# Patient Record
Sex: Female | Born: 1950
Health system: Southern US, Community
[De-identification: ages and names within clinical notes are randomized; demographics above are authoritative.]

## PROBLEM LIST (undated history)

## (undated) DIAGNOSIS — F419 Anxiety disorder, unspecified: Secondary | ICD-10-CM

## (undated) DIAGNOSIS — J309 Allergic rhinitis, unspecified: Secondary | ICD-10-CM

## (undated) DIAGNOSIS — F32A Depression, unspecified: Secondary | ICD-10-CM

## (undated) DIAGNOSIS — G709 Myoneural disorder, unspecified: Secondary | ICD-10-CM

## (undated) DIAGNOSIS — I10 Essential (primary) hypertension: Secondary | ICD-10-CM

## (undated) DIAGNOSIS — K219 Gastro-esophageal reflux disease without esophagitis: Secondary | ICD-10-CM

## (undated) DIAGNOSIS — E785 Hyperlipidemia, unspecified: Secondary | ICD-10-CM

## (undated) DIAGNOSIS — I499 Cardiac arrhythmia, unspecified: Secondary | ICD-10-CM

## (undated) DIAGNOSIS — T7840XA Allergy, unspecified, initial encounter: Secondary | ICD-10-CM

## (undated) DIAGNOSIS — M4800 Spinal stenosis, site unspecified: Secondary | ICD-10-CM

## (undated) DIAGNOSIS — G43909 Migraine, unspecified, not intractable, without status migrainosus: Secondary | ICD-10-CM

## (undated) DIAGNOSIS — K589 Irritable bowel syndrome without diarrhea: Secondary | ICD-10-CM

## (undated) DIAGNOSIS — Z9889 Other specified postprocedural states: Secondary | ICD-10-CM

## (undated) DIAGNOSIS — C801 Malignant (primary) neoplasm, unspecified: Secondary | ICD-10-CM

## (undated) DIAGNOSIS — K648 Other hemorrhoids: Secondary | ICD-10-CM

## (undated) DIAGNOSIS — F329 Major depressive disorder, single episode, unspecified: Secondary | ICD-10-CM

## (undated) DIAGNOSIS — K449 Diaphragmatic hernia without obstruction or gangrene: Secondary | ICD-10-CM

## (undated) HISTORY — DX: Cardiac arrhythmia, unspecified: I49.9

## (undated) HISTORY — PX: COLONOSCOPY: SHX174

## (undated) HISTORY — DX: Hyperlipidemia, unspecified: E78.5

## (undated) HISTORY — DX: Migraine, unspecified, not intractable, without status migrainosus: G43.909

## (undated) HISTORY — PX: WISDOM TOOTH EXTRACTION: SHX21

## (undated) HISTORY — DX: Other specified postprocedural states: Z98.890

## (undated) HISTORY — DX: Myoneural disorder, unspecified: G70.9

## (undated) HISTORY — DX: Malignant (primary) neoplasm, unspecified: C80.1

## (undated) HISTORY — DX: Gastro-esophageal reflux disease without esophagitis: K21.9

## (undated) HISTORY — DX: Anxiety disorder, unspecified: F41.9

## (undated) HISTORY — DX: Allergic rhinitis, unspecified: J30.9

## (undated) HISTORY — DX: Diaphragmatic hernia without obstruction or gangrene: K44.9

## (undated) HISTORY — DX: Other hemorrhoids: K64.8

## (undated) HISTORY — DX: Major depressive disorder, single episode, unspecified: F32.9

## (undated) HISTORY — PX: TONSILLECTOMY: SUR1361

## (undated) HISTORY — DX: Allergy, unspecified, initial encounter: T78.40XA

## (undated) HISTORY — PX: VAGINAL HYSTERECTOMY: SUR661

## (undated) HISTORY — DX: Irritable bowel syndrome, unspecified: K58.9

## (undated) HISTORY — DX: Spinal stenosis, site unspecified: M48.00

## (undated) HISTORY — DX: Depression, unspecified: F32.A

---

## 1999-08-22 ENCOUNTER — Other Ambulatory Visit: Admission: RE | Admit: 1999-08-22 | Discharge: 1999-08-22 | Payer: Self-pay | Admitting: Obstetrics and Gynecology

## 2000-10-02 ENCOUNTER — Other Ambulatory Visit: Admission: RE | Admit: 2000-10-02 | Discharge: 2000-10-02 | Payer: Self-pay | Admitting: Obstetrics and Gynecology

## 2001-03-15 ENCOUNTER — Observation Stay (HOSPITAL_COMMUNITY): Admission: RE | Admit: 2001-03-15 | Discharge: 2001-03-16 | Payer: Self-pay | Admitting: Obstetrics and Gynecology

## 2001-03-15 ENCOUNTER — Encounter (INDEPENDENT_AMBULATORY_CARE_PROVIDER_SITE_OTHER): Payer: Self-pay | Admitting: *Deleted

## 2003-12-18 ENCOUNTER — Other Ambulatory Visit: Admission: RE | Admit: 2003-12-18 | Discharge: 2003-12-18 | Payer: Self-pay | Admitting: Obstetrics and Gynecology

## 2004-03-10 ENCOUNTER — Inpatient Hospital Stay (HOSPITAL_COMMUNITY): Admission: EM | Admit: 2004-03-10 | Discharge: 2004-03-13 | Payer: Self-pay

## 2004-03-29 ENCOUNTER — Emergency Department (HOSPITAL_COMMUNITY): Admission: EM | Admit: 2004-03-29 | Discharge: 2004-03-29 | Payer: Self-pay | Admitting: Emergency Medicine

## 2004-04-16 ENCOUNTER — Ambulatory Visit (HOSPITAL_COMMUNITY): Admission: RE | Admit: 2004-04-16 | Discharge: 2004-04-16 | Payer: Self-pay | Admitting: Internal Medicine

## 2004-06-17 ENCOUNTER — Encounter: Admission: RE | Admit: 2004-06-17 | Discharge: 2004-06-17 | Payer: Self-pay | Admitting: Gastroenterology

## 2005-05-21 ENCOUNTER — Other Ambulatory Visit: Admission: RE | Admit: 2005-05-21 | Discharge: 2005-05-21 | Payer: Self-pay | Admitting: Obstetrics and Gynecology

## 2006-11-02 ENCOUNTER — Encounter: Admission: RE | Admit: 2006-11-02 | Discharge: 2006-11-02 | Payer: Self-pay | Admitting: Obstetrics and Gynecology

## 2006-11-06 ENCOUNTER — Encounter: Admission: RE | Admit: 2006-11-06 | Discharge: 2006-11-06 | Payer: Self-pay | Admitting: Obstetrics and Gynecology

## 2010-07-10 ENCOUNTER — Emergency Department (HOSPITAL_COMMUNITY): Admission: EM | Admit: 2010-07-10 | Discharge: 2010-07-10 | Payer: Self-pay | Admitting: Emergency Medicine

## 2011-01-24 NOTE — Op Note (Signed)
[facility]  Patient:    [name], [name]                     MRN: [id number] Proc. Date: 03/15/01 Adm. Date:  [id number] Attending:  Trevor Iha                           Operative Report  PREOPERATIVE DIAGNOSIS:  Menometrorrhagia, dysmenorrhea, and probable pelvic fibroids.  POSTOPERATIVE DIAGNOSIS:  Menometrorrhagia, dysmenorrhea, and probable pelvic fibroids plus pelvic adhesions.  PROCEDURE:  Laparoscopically assisted vaginal hysterectomy, bilateral salpingo-oophorectomy, and lysis of adhesions.  SURGEON:  Trevor Iha, M.D.  ASSISTANT:  Juluis Mire, M.D.  ANESTHESIA:  General endotracheal.  INDICATIONS:  Ms. Mcnew is a 60 year old, G4, P4, with worsening menometrorrhagia and dysmenorrhea over the last several years not responsive to hysteroscopy and D&C, anti-inflammatory medications, or oral contraceptive agents.  The patient presents today for definitive surgical intervention. Risks and benefits were discussed at length and informed consent was obtained. This includes, but not limited to, risk of infection, bleeding, damage to bowel, bladder, or ureters, risks associated with blood transfusion including hepatitis, AIDS, or reaction to blood transfusion.  Again, informed consent was obtained.  FINDINGS AT TIME OF SURGERY:  Slightly enlarged uterus with adhesions from the left and right tubo-ovarian complexes to the pelvic sidewalls, uterus, and bowel.  Normal appearing appendix and normal appearing liver.  DESCRIPTION OF PROCEDURE:  After adequate analgesia, the patient was placed in the dorsolithotomy position.  She was sterilely prepped and draped and bladder was thoroughly drained.  The Hulka tenaculum was placed on the anterior lip of the cervix.  A 1 cm infraumbilical skin incision was made, a 12 mm trocar was inserted, and 5 mm trocar was inserted to the left of the midline _____ of the pubic synthesis under direct  visualization.  The above findings were then noted.  Lysis of adhesions was performed with sharp Endo shears, freeing the tubes and ovaries from the pelvic sidewalls and also from the bowel, also removing bowel appendages from the posterior portion of the uterus and left tubo-ovarian complex.  Bleeding was controlled with bipolar cautery.  At this time a 5 mm camera was inserted, Endo-GIA was inserted through the 12 mm scope, and Endo-GIA was placed across the right infundibulopelvic ligament.  With good placement noted, it was fired once, reloaded, and fired a second time down to the level of the round ligament with care taken to avoid the underlying ureter and hemostasis was achieved.  This was done similarly along the left infundibulopelvic ligament down to just next to the round ligament. Some small bleeding in between the staples was controlled with bipolar cautery.  At this time, the laparoscope was removed, the abdomen was desufflated, legs were elevated, and weighted speculum was placed in the vagina.  _____ tenaculum was placed on the posterior lip of the cervix and a posterior colpotomy was performed.  Haney clamps were placed across the uterosacral ligaments bilaterally.  It was clamped, cut, and tied using 0 Monocryl sutures.  0 Monocryl was used throughout the procedure unless otherwise indicated.  The cervix was circumscribed with Bovie cautery.  The cardinal ligaments were then clamped, cut, and tied bilaterally using 0 Monocryl. Bladder and pelvis was then clamped, cut, and tied.  The bladder was then dissected off the anterior surface of the cervix and anterior peritoneum was entered.  A Diva retractor was placed  underneath the bladder.  At this time the remainder of the bladder/pelvis were clamped, cut, and tied bilaterally. Successive bites of the cardinal ligaments up into the round ligament were performed with good hemostasis achieved.  At this point the fundus of  the uterus was grasped with thyroid tenaculum, delivered into the introitus.  At this point Haney clmaps were placed across the remaining portion of the broad ligaments bilaterally, clamped, cut, and tied.  The uterus was removed.  Tubes and ovaries were also removed.  At this time it was doubly ligated across the remainder of the broad ligaments.  Good hemostasis was achieved.  At this point the uterosacral ligaments were identified bilaterally, grasped with Alice clamps, suture ligated with 0 Monocryl suture.  The posterior peritoneum was then closed with 0 Monocryl in a purse string fashion and was closed in a vertical fashion with figure-of-eight and 0 Monocryl.  The anterior vaginal mucosa was then closed with figure-of-eight and 0 Monocryl in a vertical fashion with good approximation and good hemostasis.  Uterosacral ligaments were plicated in the midline and good hemostasis was achieved at all levels.  Foley catheter was then placed with good return of clear yellow urine.  Abdomen was reinsufflated and reexamination of cul-de-sac and pelvis revealed good hemostasis.  Small plica appendage which was still adhesed to the posterior vaginal mucosa was sharply lysed with good hemostasis achieved. At this point the abdomen was desufflated, the infraumbilical skin incision was closed with 0 chromic in the fascia and a 3-0 Monocryl subcuticular stitch in the skin at umbilicus, 3-0 Monocryl of 5 mm port with good approximation. Incisions were injected with 0.25% Marcaine.  The patient tolerated the procedure well and was stable and transported to recovery room.  The sponge and instrument count was normal x 3.  ESTIMATED BLOOD LOSS:  100 cc. DD:  03/15/01 TD:  03/15/01 Job: [id number]

## 2011-01-24 NOTE — Cardiovascular Report (Signed)
NAMEPAYDEN, [name]                        ACCOUNT NO.:  [id number]   MEDICAL RECORD NO.:  [id number]                   PATIENT TYPE:  INP   LOCATION:  2905                                 FACILITY:  [facility]   PHYSICIAN:  Charlton Haws, M.D.                  DATE OF BIRTH:  May 19, 1951   DATE OF PROCEDURE:  DATE OF DISCHARGE:                              CARDIAC CATHETERIZATION   PROCEDURE:  Coronary arteriography.   INDICATIONS:  Chest pain requiring hospital admission.   Cine catheterization was done from the right femoral artery, 6 French JR4  and JL4 catheters were used for cannulating the coronaries.  The patient  tolerated the procedure well.   2 mg of Versed were given for sedation.   1. The left main coronary artery was normal.  2. The left anterior descending artery was normal.  3. The first and second diagonal branches were normal.  4. The circumflex coronary artery was nondominant and was normal.;  5. The right coronary artery was dominant, and it was normal.   RAO ventriculography:  RAO ventriculography was normal, EF was 60%.  There  was no gradient across the aortic valve and no MR.  LV pressure was 147/78,  aortic pressure was 145/18.   IMPRESSION:  The patient has no significant coronary artery disease.  The  chest pain would appear to be noncardiac in etiology.  There is a question  on admission chest x-ray of a right middle lobe pneumonia.  Follow-up PA and  lateral chest x-ray showed an apical fat pad with no evidence of pneumonia  or no active disease.   Due to the late hour, the patient will be monitored overnight.  If her groin  heals well, she will probably discharged for primary care follow-up in the  morning.                                               Charlton Haws, M.D.    PN/MEDQ  D:  03/12/2004  T:  03/13/2004  Job:  [id number]   cc:   Larina Earthly, M.D.  [address]  [place]  [place] [address]  Fax: [phone]   Rollene Rotunda, M.D.

## 2011-01-24 NOTE — H&P (Signed)
[facility]  Patient:    [name], [name]                       MRN: [id number] Adm. Date:  03/15/01 Attending:  Trevor Iha, M.D.                         History and Physical  DATE OF BIRTH:  1950-11-27  HISTORY OF PRESENT ILLNESS:  [name] is a 60 year old, G4, P4 with worsening menometrorrhagia and known leiomyomatous uterus who presents for definitive surgical intervention.  She has had menometrorrhagia over the last several years which has not improved by having a hysteroscopy, dilatation and curettage in January 1999.  She has been tried on oral contraceptive agents and has continued to have abnormal uterine bleeding despite doubling up on oral contraceptive agents which does give her headaches.  Also has episodes of cramping and pelvic pain which required high dose of anti-inflammatory medications.  Because of the persistent menometrorrhagia, dysmenorrhea and fibroids the patient presents for definitive surgical intervention and presents for hysterectomy.  PAST MEDICAL HISTORY:  Negative.  PAST SURGICAL HISTORY:  Significant for tubal ligation.  PAST OB HISTORY:  She has had four vaginal deliveries.  GYN HISTORY:  No history of sexually transmitted diseases or abnormal Pap smears.  MEDICATIONS:  Currently on none.  ALLERGIES:  She has no known drug allergies.  PHYSICAL EXAMINATION:  VITAL SIGNS:  Blood pressure is 120/82.  HEART: Regular rate and rhythm.  LUNGS:  Clear to auscultation bilaterally.  ABDOMEN:  Nondistended.  Nontender.  PELVIC:  Uterus is anteverted and mobile, nontender.  No adnexal masses are palpable.  Ultrasound from approximately one year ago did show 12.9 x 5.3 x 7 cm uterus with several intramural leiomyomas noted at 19 x 13 mm and 19 x 16 mm.  Normal appearing ovaries.  IMPRESSION:  Menometrorrhagia, pelvic pain, dysmenorrhea, probable fibroids by ultrasound and dilatation and curettage.   None responsive to conservative medical management including oral contraceptive agents or dilatation and curettage.  The patient desires definitive surgical intervention.  PLAN:  Laparoscopically assisted vaginal hysterectomy and bilateral salpingo-oophorectomy.  The risks and benefits of procedure were discussed at length which include but are not limited to risk of infection, bleeding, damage to bowel, bladder, ureters; also the risks associated with hormone replacement therapy after removing the tubes and ovaries.  Discussed at length the different options including ovarian preservation versus removal.  At this time, we will plan to proceed with laparoscopically assisted vaginal hysterectomy and bilateral salpingo-oophorectomy.  The risks also associated with blood transfusion including risks of infection such as hepatitis or AIDS and also reactions to blood transfusion were also discussed with the patient. Again informed consent was obtained.DD:  03/14/01 TD:  03/14/01 Job: [id number]

## 2011-01-24 NOTE — H&P (Signed)
[name], [name]                        ACCOUNT NO.:  [id number]   MEDICAL RECORD NO.:  [id number]                   PATIENT TYPE:  INP   LOCATION:  2905                                 FACILITY:  [facility]   PHYSICIAN:  Tereso Newcomer, P.A.                  DATE OF BIRTH:  September 12, 1950   DATE OF ADMISSION:  03/10/2004  DATE OF DISCHARGE:                                HISTORY & PHYSICAL   ADDENDUM:  The patient will also need outpatient carotid Dopplers to further  evaluate the questionable right carotid bruit.  This will be arranged at  discharge.                                                Tereso Newcomer, P.A.    SW/MEDQ  D:  03/10/2004  T:  03/11/2004  Job:  [id number]   cc:   Rollene Rotunda, M.D.   Larina Earthly, M.D.  [address]  [place]  [place] [address]  Fax: [phone]

## 2011-01-24 NOTE — H&P (Signed)
NAMEROSE, HEGNER                        ACCOUNT NO.:  [id number]   MEDICAL RECORD NO.:  [id number]                   PATIENT TYPE:  INP   LOCATION:  2905                                 FACILITY:  [facility]   PHYSICIAN:  Tereso Newcomer, P.A.                  DATE OF BIRTH:  1951/08/28   DATE OF ADMISSION:  03/10/2004  DATE OF DISCHARGE:                                HISTORY & PHYSICAL   CHIEF COMPLAINT:  Chest pain.   HISTORY OF PRESENT ILLNESS:  Ms. Cory is a very pleasant 60 year old very  pleasant female with known history of coronary artery disease, who presents  to the emergency room today __________.  Over the last couple of months she  has had some occasional episodes of chest pain.  These episodes have been  associated with headache, lower extremity swelling as well as neck  tightness.  She says it feels like she is choking.  It is really not  associated with exertion.  Last night it was the worst ever.  __________.  She had her blood sugars checked.  They were elevated __________.  Worse  __________ at __________  health care.  She tried to work __________.                                                Tereso Newcomer, Rochel Brome.    SW/MEDQ  D:  03/10/2004  T:  03/11/2004  Job:  [id number]   cc:   Larina Earthly, M.D.  [address]  [place]  [place] [address]  Fax: [phone]

## 2011-01-24 NOTE — Discharge Summary (Signed)
[facility]  Patient:    [name], [name]                     MRN: [id number] Adm. Date:  [id number] Disc. Date: 03/16/01 Attending:  Trevor Iha                           Discharge Summary  HISTORY OF PRESENT ILLNESS:  [name] is a 60 year old G4, P4 with worsening menomenorrhagia, dysmenorrhea over the last several years not responsive to [facility], anti-inflammatory medications, or oral contraceptive agents.  She presented for laparoscopically assisted vaginal hysterectomy, bilateral salpingo-oophorectomy.  HOSPITAL COURSE:  Patient underwent LAVH, BSO with lysis of adhesions. Findings at the time of surgery was a slightly enlarged uterus with adhesions to the left tube and right tubo-ovarian complexes to the pelvis side wall, uterus, and bowel.  Otherwise, normal appearing appendix and liver.  Surgery was uncomplicated.  Estimated blood loss was 100 cc.  Hospital course was uncomplicated.  By postoperative day #1 she was ambulating without difficulty, tolerating a regular diet, passing flatus.  Postoperative hemoglobin was 9.6. Patient was discharged home.  At the time of discharge the incision was clean, dry, and intact.  Bowel sounds were positive.  DISPOSITION:  Patient will be discharged home with followup in the office in one to two weeks.  She is sent home with a prescription for Motrin 800 mg q.8h. p.r.n. total of 30 and Tylox one to two q.4h. p.r.n. #30.  DISCHARGE DIAGNOSES:  Menomenorrhagia, dysmenorrhea, pelvic pain, pelvic adhesive disease.  CONDITION ON DISCHARGE:  Good. DD:  03/16/01 TD:  03/16/01 Job: [id number]

## 2011-01-24 NOTE — Discharge Summary (Signed)
[name], [name]                        ACCOUNT NO.:  [id number]   MEDICAL RECORD NO.:  [id number]                   PATIENT TYPE:  INP   LOCATION:  2029                                 FACILITY:  [facility]   PHYSICIAN:  Rollene Rotunda, M.D.                DATE OF BIRTH:  02/07/1951   DATE OF ADMISSION:  03/10/2004  DATE OF DISCHARGE:  03/13/2004                           DISCHARGE SUMMARY - REFERRING   DISCHARGE DIAGNOSES:  1. Chest pain, noncardiac.  2. Right carotid bruit.  3. Hyperlipidemia, treated.  4. Hypertension, treated.  5. Spinal stenosis.  6. History of C2 fracture in 1992.  7. Status post hysterectomy, 2001.   HOSPITAL COURSE:  [name] is a 60 year old patient of Dr. Felipa Eth of  [facility] who has no known history of coronary artery disease.  She  presented to the emergency room on March 10, 2004 complaining of chest pain  over the past 24 hours.  She had neck tightness as well as nausea,  diaphoresis, and shortness of breath.  After administration of heparin,  nitroglycerin, and morphine her pain had decreased.   Part of her workup included laboratory studies that revealed normal cardiac  isoenzymes.  Her BMET and CBC were essentially normal.  Total cholesterol  202, triglycerides 111, LDL 127, HDL was 53.  Her TSH was 3.162.  Chest x-  ray:  No active findings on March 12, 2004.  EKG reveals poor R wave  progression in the anterior leads with otherwise no acute ST-T wave changes.   The patient underwent cardiac catheterization on March 12, 2004 and had  angiographically-normal coronary arteries with a normal EF, no MR.  The  following day on March 13, 2004 the patient was discharged to home in stable  condition and appointments were made for follow-up.   DISCHARGE MEDICATIONS:  1. Enteric-coated aspirin 325 mg a day.  2. Toprol-XL 25 mg a day.  3. Captopril 6.25 t.i.d.  4. Lipitor 80 mg q.h.s.  5. Protonix 40 mg a day.   She is asked to buy a blood  pressure cuff and take her blood pressure two to  three times a day, record, and bring to her next visit.  She is also to  bring the cuff in so we check the calibration. May use Tylenol as needed.  No straining  or heavy lifting for 1 week.  Remain on a low fat diet.  Clean over  catheterization site with soap and water, no scrubbing.  She is to call [phone] for any questions or concerns.  On March 29, 2004 she is to come to our  office at 1:30 p.m. for a carotid ultrasound and then a visit at 2:30 p.m.      Guy Franco, P.A. [facility]                      Rollene Rotunda, M.D.  LB/MEDQ  D:  04/22/2004  T:  04/22/2004  Job:  [id number]   cc:   Larina Earthly, M.D.  [address]  [place]  [place] [address]  Fax: [phone]

## 2011-01-24 NOTE — H&P (Signed)
NAMEIRIE, DOWSON                        ACCOUNT NO.:  [id number]   MEDICAL RECORD NO.:  [id number]                   PATIENT TYPE:  EMS   LOCATION:  MAJO                                 FACILITY:  [facility]   PHYSICIAN:  Rollene Rotunda, M.D.                DATE OF BIRTH:  10/14/1950   DATE OF ADMISSION:  03/10/2004  DATE OF DISCHARGE:                                HISTORY & PHYSICAL   PRIMARY CARE PHYSICIAN:  Dr. Larina Earthly at [facility].   CARDIOLOGIST:  None -- she will be established with Dr. Rollene Rotunda.   CHIEF COMPLAINT:  Chest pain.   HISTORY OF PRESENT ILLNESS:  Ms. Habermann is a very pleasant 60 year old  female patient with no known history of coronary artery disease, who  presents to [facility] Emergency Room today with complaints of chest  discomfort beginning around 5 p.m. yesterday evening.  She has had  occasional episodes of chest discomfort over the last couple of months.  These would come and go.  There has really been no relation to exertion.  Her associated symptoms include throat tightness.  She says that it feels as  if it is choking her.  She also notes associated nausea and diaphoresis.  Her symptoms were their worst last night.  She works the night shift as a  Engineer, civil (consulting) at [employer].  Her symptoms usually begin with a headache  and lower extremity swelling.  She decided to go ahead and go to work.  She  noted that her blood pressure was elevated at 150/100 when she got to work.  She eventually decided to leave work and come to the emergency room.  In the  ER, she was placed on heparin and nitroglycerin as well as given aspirin and  morphine.  Originally, her chest discomfort was an 8/10 and now it is a  2/10.  She describes her chest discomfort as a tightness and actually  displays the Farwell sign when she describes it.  She also notes associated  shortness of breath.  She denies any syncope or presyncope.   PAST MEDICAL HISTORY:   Past medical history is significant for hysterectomy,  spinal stenosis and a history of C2 fracture.  She recently had her  cholesterol checked with her primary care physician and was noted to have  hypercholesterolemia with an LDL of 190.  Currently, she is on diet therapy  only.  She denies any known history of hypertension or diabetes mellitus or  thyroid disease.   CURRENT MEDICATIONS:  Current medications include allergy shots.   ALLERGIES:  No known drug allergies.   SOCIAL HISTORY:  She denies any tobacco or alcohol abuse.  She is married.  She lives in [place].  She works as a Engineer, civil (consulting) at [employer].   FAMILY HISTORY:  Family history is insignificant for any premature coronary  artery disease.  Her mother died have  an abdominal aortic aneurysm as well  as hypertension.  Her father died of emphysema.   REVIEW OF SYSTEMS:  Please see HPI.  She has had some arthralgias that seem  to be secondary to her spinal stenosis.  She also notes headaches as noted  above with her chest discomfort.  She denies any fevers, chills, cough, sore  throat, numbness, tingling, melena, hematochezia, hematuria, dysuria,  odynophagia, dysphagia or acid reflux symptoms.  Rest of the review of  systems are negative.   PHYSICAL EXAMINATION:  GENERAL:  She is a well-nourished, well-developed  female in no acute distress.  VITAL SIGNS:  Blood pressure is 130/72, pulse 60, respirations 12-15.  HEENT:  Head normocephalic, atraumatic.  Eyes:  PERRLA.  EOMI.  Sclerae are  clear.  ENT:  Oropharynx is pink without exudate.  NECK:  Neck without JVD.  ENDOCRINE:  Without thyromegaly.  LYMPH:  Without lymphadenopathy.  CARDIAC:  Normal S1 and S2.  Regular rate and rhythm without murmurs.  LUNGS:  Lungs are clear to auscultation bilaterally.  ABDOMEN:  Abdomen is soft and nontender without hepatomegaly.  EXTREMITIES:  Extremities without edema.  VASCULAR EXAM:  Carotid pulses 2+ bilaterally.  There is  a questionable very  faint right carotid bruit noted.  Femoral artery pulses are 2+ bilaterally  without bruits.  Distal pulses are 2+.  SKIN:  Skin is warm and dry.  NEUROLOGIC:  Nonfocal.   ACCESSORY CLINICAL DATA:  Electrocardiogram initially reveals normal sinus  rhythm with a heart rate of 83, normal axis, old anterior myocardial  infarction, minimal ST depression in V5 and V6.  Repeat EKG reveals sinus  rhythm with a heart rate of 60, occasional PVCs and continued anterior Q  waves.   Chest x-ray:  Questionable right middle lobe pneumonia.  Lateral film done  that shows that there is no pneumonia but probable prominent fat pad.   LABORATORY DATA:  Point-of-care enzymes negative x3.  Glucose 95, sodium  141, potassium 3.7.  Hemoglobin 13.6, hematocrit 40.  Creatinine 1.0.   IMPRESSION:  1. Unstable angina pectoris.  2. New diagnosis of hypertension.  3. Hypercholesterolemia -- untreated.  4. History of spinal stenosis.  5. Probable right carotid bruit noted on exam.   PLAN:  The patient will be admitted to [facility].  We will keep  her on telemetry and treat her with aspirin, heparin and nitroglycerin drip.  We will try to initiate low-dose beta blocker if her heart rate will allow.  We will also initiate low-dose ACE inhibitor with captopril 6.25 mg 3 times  a day.  We will place her a high-dose statin,  Lipitor 80 mg nightly, and cover her with Protonix 40 mg a day for GI  prophylaxis.  We will treat her pain with morphine and set her up for  cardiac catheterization on March 12, 2004.  Risks and benefits have been  explained to the patient and she agrees to proceed.      Tereso Newcomer, P.A.                        Rollene Rotunda, M.D.    SW/MEDQ  D:  03/10/2004  T:  03/11/2004  Job:  [id number]   cc:   Rollene Rotunda, M.D.   Larina Earthly, M.D.  [address]  [place]  [place] [address]  Fax: [phone]

## 2012-09-13 ENCOUNTER — Emergency Department (HOSPITAL_COMMUNITY)
Admission: EM | Admit: 2012-09-13 | Discharge: 2012-09-14 | Disposition: A | Discharge status: Home / Self Care | Payer: BC Managed Care – PPO | Attending: Emergency Medicine | Admitting: Emergency Medicine

## 2012-09-13 DIAGNOSIS — R42 Dizziness and giddiness: Secondary | ICD-10-CM | POA: Insufficient documentation

## 2012-09-13 DIAGNOSIS — R110 Nausea: Secondary | ICD-10-CM | POA: Insufficient documentation

## 2012-09-13 DIAGNOSIS — I10 Essential (primary) hypertension: Secondary | ICD-10-CM | POA: Insufficient documentation

## 2012-09-13 HISTORY — DX: Essential (primary) hypertension: I10

## 2012-09-14 ENCOUNTER — Emergency Department (HOSPITAL_COMMUNITY): Payer: BC Managed Care – PPO

## 2012-09-14 ENCOUNTER — Encounter (HOSPITAL_COMMUNITY): Payer: Self-pay | Admitting: *Deleted

## 2012-09-14 LAB — BASIC METABOLIC PANEL
BUN: 10 mg/dL (ref 6–23)
CO2: 26 mEq/L (ref 19–32)
Calcium: 9.7 mg/dL (ref 8.4–10.5)
Chloride: 103 mEq/L (ref 96–112)
Creatinine, Ser: 0.91 mg/dL (ref 0.50–1.10)
GFR calc Af Amer: 77 mL/min — ABNORMAL LOW (ref >90)
GFR calc non Af Amer: 67 mL/min — ABNORMAL LOW (ref >90)
Glucose, Bld: 91 mg/dL (ref 70–99)
Potassium: 3.4 mEq/L — ABNORMAL LOW (ref 3.5–5.1)
Sodium: 139 mEq/L (ref 135–145)

## 2012-09-14 LAB — CBC
HCT: 41.6 % (ref 36.0–46.0)
Hemoglobin: 14.3 g/dL (ref 12.0–15.0)
MCH: 30.4 pg (ref 26.0–34.0)
MCHC: 34.4 g/dL (ref 30.0–36.0)
MCV: 88.5 fL (ref 78.0–100.0)
Platelets: 336 10*3/uL (ref 150–400)
RBC: 4.7 MIL/uL (ref 3.87–5.11)
RDW: 12.4 % (ref 11.5–15.5)
WBC: 8.4 10*3/uL (ref 4.0–10.5)

## 2012-09-14 LAB — POCT I-STAT TROPONIN I: Troponin i, poc: 0 ng/mL (ref 0.00–0.08)

## 2012-09-14 MED ORDER — PROMETHAZINE HCL 25 MG/ML IJ SOLN
12.5000 mg | Freq: Once | INTRAMUSCULAR | Status: AC
  Administered 2012-09-14: 12.5 mg via INTRAVENOUS
  Filled 2012-09-14 (×2): qty 1

## 2012-09-14 MED ORDER — PROMETHAZINE HCL 25 MG PO TABS: 25.0000 mg | ORAL_TABLET | Freq: Four times a day (QID) | ORAL | Status: DC | PRN

## 2012-09-14 MED ORDER — DIAZEPAM 5 MG/ML IJ SOLN
2.5000 mg | Freq: Once | INTRAMUSCULAR | Status: AC
  Administered 2012-09-14: 2.5 mg via INTRAVENOUS
  Filled 2012-09-14: qty 2

## 2012-09-14 MED ORDER — ONDANSETRON 8 MG PO TBDP
8.0000 mg | ORAL_TABLET | Freq: Once | ORAL | Status: AC
  Administered 2012-09-14: 8 mg via ORAL
  Filled 2012-09-14: qty 1

## 2012-09-14 NOTE — ED Provider Notes (Signed)
History     CSN: [id number]  Arrival date & time 09/13/12  2354   First MD Initiated Contact with Patient 09/14/12 [phone]      Chief Complaint  Patient presents with  . Nausea    (Consider location/radiation/quality/duration/timing/severity/associated sxs/prior treatment) HPI This is a 62 year old female who has had episodic nausea since Christmas Day. Her episodes usually follow a meal. They do not occur after every meal. Her current episode began yesterday evening about 7 PM after dinner. The nausea was moderate to severe at its worst, with a sensation of a tightness in her throat. She never vomited. She has had no diarrhea with it. She was not short of breath. She was dizzy with it which he describes as lightheadedness. The symptoms are exacerbated by movement and improved with lying still although they never abated completely. She states that when she has the symptoms there's a baseline level of nausea with waves of worsening.  Past Medical History  Diagnosis Date  . Hypertension     History reviewed. No pertinent past surgical history.  No family history on file.  History  Substance Use Topics  . Smoking status: Never Smoker   . Smokeless tobacco: Not on file  . Alcohol Use: No    OB History    Grav Para Term Preterm Abortions TAB SAB Ect Mult Living                  Review of Systems  All other systems reviewed and are negative.    Allergies  Review of patient's allergies indicates no known allergies.  Home Medications   Current Outpatient Rx  Name  Route  Sig  Dispense  Refill  . BUPROPION HCL ER (SR) 100 MG PO TB12   Oral   Take 100 mg by mouth daily.         . IRBESARTAN 150 MG PO TABS   Oral   Take 75 mg by mouth at bedtime. Taking a half Tab         . OMEPRAZOLE 20 MG PO CPDR   Oral   Take 40 mg by mouth daily.           BP 141/65  Pulse 63  Temp 98.1 F (36.7 C)  Resp 13  SpO2 100%  Physical Exam General: Well-developed,  well-nourished female in no acute distress; appearance consistent with age of record HENT: normocephalic, atraumatic Eyes: pupils equal round and reactive to light; extraocular muscles intact; no nystagmus Neck: supple Heart: regular rate and rhythm; no murmurs, rubs or gallops Lungs: clear to auscultation bilaterally Abdomen: soft; nondistended; nontender; bowel sounds present Extremities: No deformity; full range of motion; pulses normal Neurologic: Awake, alert and oriented; motor function intact in all extremities and symmetric; no facial droop Skin: Warm and dry Psychiatric: Mildly anxious ,  ED Course  Procedures (including critical care time)     MDM   Nursing notes and vitals signs, including pulse oximetry, reviewed.  Summary of this visit's results, reviewed by myself:  Labs:  Results for orders placed during the hospital encounter of 09/13/12 (from the past 24 hour(s))  CBC     Status: Normal   Collection Time   09/14/12 12:30 AM      Component Value Range   WBC 8.4  4.0 - 10.5 K/uL   RBC 4.70  3.87 - 5.11 MIL/uL   Hemoglobin 14.3  12.0 - 15.0 g/dL   HCT 11.9  14.7 - 82.9 %  MCV 88.5  78.0 - 100.0 fL   MCH 30.4  26.0 - 34.0 pg   MCHC 34.4  30.0 - 36.0 g/dL   RDW 16.1  09.6 - 04.5 %   Platelets 336  150 - 400 K/uL  BASIC METABOLIC PANEL     Status: Abnormal   Collection Time   09/14/12 12:30 AM      Component Value Range   Sodium 139  135 - 145 mEq/L   Potassium 3.4 (*) 3.5 - 5.1 mEq/L   Chloride 103  96 - 112 mEq/L   CO2 26  19 - 32 mEq/L   Glucose, Bld 91  70 - 99 mg/dL   BUN 10  6 - 23 mg/dL   Creatinine, Ser 4.09  0.50 - 1.10 mg/dL   Calcium 9.7  8.4 - 81.1 mg/dL   GFR calc non Af Amer 67 (*) >90 mL/min   GFR calc Af Amer 77 (*) >90 mL/min  POCT I-STAT TROPONIN I     Status: Normal   Collection Time   09/14/12 12:36 AM      Component Value Range   Troponin i, poc 0.00  0.00 - 0.08 ng/mL   Comment 3             Imaging Studies: Dg Chest Port 1  View  09/14/2012  *RADIOLOGY REPORT*  Clinical Data: Nausea and chest pain.  PORTABLE CHEST - 1 VIEW  Comparison: 07/10/2010.  Findings: Normal heart size with clear lung fields.  No bony abnormality.   Right-sided epicardial fat pad. No change from priors.  IMPRESSION:  Negative exam.   Original Report Authenticated By: Davonna Belling, M.D.    EKG Interpretation:  Date & Time: 09/14/2012 12:05 AM  Rate: 72  Rhythm: normal sinus rhythm  QRS Axis: normal  Intervals: normal  ST/T Wave abnormalities: normal  Conduction Disutrbances:none  Narrative Interpretation: Poor R-wave progression  Old EKG Reviewed: unchanged  4:39 AM No change with IV Valium. Patient denies any chest symptoms.  6:05 AM Nausea improved with IV Phenergan. We'll prescribe Phenergan tablets. Patient will followup with Dr. Felipa Eth for recurrent unexplained nausea.           Hanley Seamen, MD 09/14/12 [phone]

## 2012-09-14 NOTE — ED Notes (Signed)
Pt c/o nausea starting at 1900 after eating; states has chest tightness on and off with the waves of nausea; has had several similar episodes since Christmas; c/o dizziness

## 2012-09-17 ENCOUNTER — Other Ambulatory Visit: Payer: Self-pay | Admitting: Internal Medicine

## 2012-09-17 DIAGNOSIS — R110 Nausea: Secondary | ICD-10-CM

## 2012-09-24 ENCOUNTER — Ambulatory Visit
Admission: RE | Admit: 2012-09-24 | Discharge: 2012-09-24 | Disposition: A | Discharge status: Home / Self Care | Payer: BC Managed Care – PPO | Source: Ambulatory Visit | Attending: Internal Medicine | Admitting: Internal Medicine

## 2012-09-24 DIAGNOSIS — R110 Nausea: Secondary | ICD-10-CM

## 2012-09-30 ENCOUNTER — Encounter: Payer: Self-pay | Admitting: Internal Medicine

## 2012-10-22 ENCOUNTER — Ambulatory Visit: Payer: BC Managed Care – PPO | Admitting: Internal Medicine

## 2012-11-02 ENCOUNTER — Encounter: Payer: Self-pay | Admitting: Internal Medicine

## 2012-11-04 ENCOUNTER — Encounter: Payer: Self-pay | Admitting: Internal Medicine

## 2012-11-04 ENCOUNTER — Ambulatory Visit (INDEPENDENT_AMBULATORY_CARE_PROVIDER_SITE_OTHER): Payer: BC Managed Care – PPO | Admitting: Internal Medicine

## 2012-11-04 ENCOUNTER — Other Ambulatory Visit (INDEPENDENT_AMBULATORY_CARE_PROVIDER_SITE_OTHER): Payer: BC Managed Care – PPO

## 2012-11-04 VITALS — BP 152/84 | HR 72 | Ht 63.0 in | Wt 183.2 lb

## 2012-11-04 DIAGNOSIS — Z1211 Encounter for screening for malignant neoplasm of colon: Secondary | ICD-10-CM

## 2012-11-04 DIAGNOSIS — R143 Flatulence: Secondary | ICD-10-CM

## 2012-11-04 DIAGNOSIS — K589 Irritable bowel syndrome without diarrhea: Secondary | ICD-10-CM | POA: Insufficient documentation

## 2012-11-04 DIAGNOSIS — R1010 Upper abdominal pain, unspecified: Secondary | ICD-10-CM

## 2012-11-04 DIAGNOSIS — I10 Essential (primary) hypertension: Secondary | ICD-10-CM | POA: Insufficient documentation

## 2012-11-04 DIAGNOSIS — R140 Abdominal distension (gaseous): Secondary | ICD-10-CM

## 2012-11-04 DIAGNOSIS — K219 Gastro-esophageal reflux disease without esophagitis: Secondary | ICD-10-CM | POA: Insufficient documentation

## 2012-11-04 DIAGNOSIS — R109 Unspecified abdominal pain: Secondary | ICD-10-CM

## 2012-11-04 DIAGNOSIS — R141 Gas pain: Secondary | ICD-10-CM

## 2012-11-04 LAB — CBC
HCT: 37.7 % (ref 36.0–46.0)
Hemoglobin: 12.8 g/dL (ref 12.0–15.0)
MCHC: 34 g/dL (ref 30.0–36.0)
MCV: 89.3 fl (ref 78.0–100.0)
Platelets: 309 10*3/uL (ref 150.0–400.0)
RBC: 4.23 Mil/uL (ref 3.87–5.11)
RDW: 13.1 % (ref 11.5–14.6)
WBC: 6.2 10*3/uL (ref 4.5–10.5)

## 2012-11-04 LAB — IGA: IgA: 596 mg/dL — ABNORMAL HIGH (ref 68–378)

## 2012-11-04 LAB — COMPREHENSIVE METABOLIC PANEL
ALT: 20 U/L (ref 0–35)
AST: 16 U/L (ref 0–37)
Albumin: 3.9 g/dL (ref 3.5–5.2)
Alkaline Phosphatase: 80 U/L (ref 39–117)
BUN: 12 mg/dL (ref 6–23)
CO2: 27 mEq/L (ref 19–32)
Calcium: 9.4 mg/dL (ref 8.4–10.5)
Chloride: 105 mEq/L (ref 96–112)
Creatinine, Ser: 0.9 mg/dL (ref 0.4–1.2)
GFR: 67.45 mL/min (ref >60.00)
Glucose, Bld: 83 mg/dL (ref 70–99)
Potassium: 3.4 mEq/L — ABNORMAL LOW (ref 3.5–5.1)
Sodium: 140 mEq/L (ref 135–145)
Total Bilirubin: 0.5 mg/dL (ref 0.3–1.2)
Total Protein: 7.3 g/dL (ref 6.0–8.3)

## 2012-11-04 LAB — TSH: TSH: 1.41 u[IU]/mL (ref 0.35–5.50)

## 2012-11-04 LAB — LIPASE: Lipase: 27 U/L (ref 11.0–59.0)

## 2012-11-04 MED ORDER — HYOSCYAMINE SULFATE 0.125 MG SL SUBL: 0.1250 mg | SUBLINGUAL_TABLET | SUBLINGUAL | Status: DC | PRN

## 2012-11-04 MED ORDER — PEG-KCL-NACL-NASULF-NA ASC-C 100 G PO SOLR: 1.0000 | Freq: Once | ORAL | Status: DC

## 2012-11-04 NOTE — Progress Notes (Signed)
Patient ID: [name], female   DOB: [birth year], 62 y.o.   MRN: [id number]  SUBJECTIVE: HPI [name] is a 62 yo female with PMH hypertension, GERD, irritable bowel who seen in consultation at the request of Dr. Felipa Eth for evaluation of upper abdominal pain.  Patient reports a long-standing history of "irritable stomach" which also seems to run in her family. She reports she has erratic bowel habits and "never knows what she is going to get". This is been going on for decades and seems to be triggered by eating. She occasionally has urgent bowel movements which can be loose and at other times have constipation. She's had no blood in her stool nor melena. Separate from her bowel habits, she has noticed upper abdominal pain both in the epigastrium and left upper quadrant which is worse after eating. She had severe nausea in early January, severe enough to necessitate an ER visit.  There she had an abdominal ultrasound and upper GI series. At that time her Prilosec was doubled from 40 mg daily to 40 mg twice daily and she was given a prescription for promethazine. Her nausea has significantly improved and she has not needed to take promethazine. She's not having any heartburn, dysphagia or odynophagia. She does report along with the upper abdominal discomfort, significant bloating. No fevers or chills. No vomiting. No significant weight loss  Colonoscopy was performed by Dr. Vilinda Boehringer in 2009, recommended repeat interval 5 years. This colonoscopy was normal, with recommended repeat interval based on family history of colon polyps  Review of Systems  As per history of present illness, otherwise negative   Past Medical History  Diagnosis Date  . Hypertension   . H/O colonoscopy     5 14 09 Dr Randa Evens  . GERD (gastroesophageal reflux disease)   . Allergic rhinitis   . Spinal stenosis   . Irregular heart beats   . Anxiety   . Depression   . IBS (irritable bowel syndrome)     Current  Outpatient Prescriptions  Medication Sig Dispense Refill  . buPROPion (WELLBUTRIN SR) 100 MG 12 hr tablet Take 100 mg by mouth daily.      . irbesartan (AVAPRO) 150 MG tablet Take 75 mg by mouth at bedtime. Taking a half Tab      . omeprazole (PRILOSEC) 40 MG capsule Take 40 mg by mouth 2 (two) times daily.      . promethazine (PHENERGAN) 25 MG tablet Take 1 tablet (25 mg total) by mouth every 6 (six) hours as needed for nausea.  20 tablet  0  . hyoscyamine (LEVSIN SL) 0.125 MG SL tablet Place 1 tablet (0.125 mg total) under the tongue every 4 (four) hours as needed for cramping.  30 tablet  0  . peg 3350 powder (MOVIPREP) 100 G SOLR Take 1 kit (100 g total) by mouth once.  1 kit  0   No current facility-administered medications for this visit.    No Known Allergies  Family History  Problem Relation Age of Onset  . Uterine cancer Mother   . Colon cancer Neg Hx   . Colon polyps Mother   . Heart disease Mother   . Irritable bowel syndrome Mother   . Irritable bowel syndrome Maternal Aunt   . Irritable bowel syndrome Cousin     x 4    History  Substance Use Topics  . Smoking status: Never Smoker   . Smokeless tobacco: Never Used  . Alcohol Use: Yes  Comment: very rare    OBJECTIVE: BP 152/84  Pulse 72  Ht 5\' 3"  (1.6 m)  Wt 183 lb 3.2 oz (83.099 kg)  BMI 32.46 kg/m2 Constitutional: Well-developed and well-nourished. No distress. HEENT: Normocephalic and atraumatic. Oropharynx is clear and moist. No oropharyngeal exudate. Conjunctivae are normal. No scleral icterus. Neck: Neck supple. Trachea midline. Cardiovascular: Normal rate, regular rhythm and intact distal pulses. No M/R/G Pulmonary/chest: Effort normal and breath sounds normal. No wheezing, rales or rhonchi. Abdominal: Soft, mild epigastric tenderness without rebound or guard, nondistended. Bowel sounds active throughout. There are no masses palpable. No hepatosplenomegaly. Extremities: no clubbing, cyanosis, or  edema Lymphadenopathy: No cervical adenopathy noted. Neurological: Alert and oriented to person place and time. Skin: Skin is warm and dry. No rashes noted. Psychiatric: Normal mood and affect. Behavior is normal.  Labs and Imaging -- CBC    Component Value Date/Time   WBC 8.4 09/14/2012 0030   RBC 4.70 09/14/2012 0030   HGB 14.3 09/14/2012 0030   HCT 41.6 09/14/2012 0030   PLT 336 09/14/2012 0030   MCV 88.5 09/14/2012 0030   MCH 30.4 09/14/2012 0030   MCHC 34.4 09/14/2012 0030   RDW 12.4 09/14/2012 0030    CMP     Component Value Date/Time   NA 139 09/14/2012 0030   K 3.4* 09/14/2012 0030   CL 103 09/14/2012 0030   CO2 26 09/14/2012 0030   GLUCOSE 91 09/14/2012 0030   BUN 10 09/14/2012 0030   CREATININE 0.91 09/14/2012 0030   CALCIUM 9.7 09/14/2012 0030   GFRNONAA 67* 09/14/2012 0030   GFRAA 77* 09/14/2012 0030   COMPLETE ABDOMINAL ULTRASOUND  - Jan 2014   Comparison:  CT abdomen of 06/17/2004   Findings:   Gallbladder:  The gallbladder is slightly contracted.  However, near the gallbladder fundus there are septations which are somewhat thickened and these septations do appear to contain blood flow. Although this area could represent focal adenomyomatosis, gallbladder carcinoma cannot be excluded and further assessment with MRI is recommended.  No gallstones are seen and there is no pain over the gallbladder with compression.   Common bile duct:  The common bile duct is somewhat prominent measuring 8.2 mm in diameter.   Liver:  The liver is slightly echogenic suggesting mild fatty infiltration.  No focal abnormality is seen. No central intrahepatic ductal dilatation is seen.   IVC:  Appears normal.   Pancreas:  The head and tail of the pancreas are obscured by bowel gas.   Spleen:  The spleen is normal measuring 5.9 cm sagittally.   Right Kidney:  No hydronephrosis is seen.  The right kidney measures 11.3 cm sagittally.  A parapelvic cyst is present of 1.5 cm in diameter.   Left Kidney:   No hydronephrosis is noted.  The left kidney measures 12.5 cm.  Parapelvic cysts are noted, the largest of 3.6 cm in diameter.   Abdominal aorta:  The abdominal aorta is normal in caliber.   IMPRESSION:   1.  Thickened septations with internal blood flow within the fundus of the gallbladder without definite gallstones.  This could represent focal adenomyomatosis, but gallbladder carcinoma cannot be excluded.  MRI of the liver is recommended with MRCP. 2.  Slightly prominent common bile duct measuring 8.2 mm without definite filling defect.  Again MRI assessment with MRCP is recommended. 3.  Mild fatty infiltration of the liver. 4.  The head and tail the pancreas are obscured by bowel gas. 5.  Bilateral parapelvic  renal cysts.  No hydronephrosis.   These findings were discussed with the physician covering for Dr. Felipa Eth, and the decision was made to continue with the scheduled upper GI and consider MRI for further assessment after clearing of the barium.   UPPER GI SERIES WITH KUB   Technique:  Routine upper GI series was performed with thin and high density barium.   Fluoroscopy Time: 1.9 minutes   Comparison:  Ultrasound of the abdomen from today   Findings: A preliminary film of the abdomen shows a nonspecific bowel gas pattern.  No opaque calculi are seen.  There are degenerative changes primarily at L4-5 and L5-S1.   A double contrast study was performed.  The esophagus is unremarkable.  A single contrast study shows the swallowing mechanism to be normal.  There is a small hiatal hernia present and moderate gastroesophageal reflux is demonstrated.  A barium pill was given at the end of the study which did pass into the stomach without delay.   The stomach is normal in contour and peristalsis.  The duodenal bulb fills and the duodenal loop is in normal position.   IMPRESSION:   1.  Small hiatal hernia with moderate gastroesophageal reflux. 2.  Barium pill passes  into the stomach without delay. 3.  No abnormality of the stomach or duodenum.   ASSESSMENT AND PLAN:  62 yo female with PMH hypertension, GERD, irritable bowel who seen in consultation at the request of Dr. Felipa Eth for evaluation of upper abdominal pain.  1.  Upper abd pain/bloating -- given the patient's abnormality of the gallbladder seen on ultrasound I recommended cross-sectional imaging with MRI abdomen plus MRCP. Her common bile duct was slightly prominent in the MRCP images should help better define this anatomy.  Increasing her PPI to twice daily seems to have helped her nausea, but her upper abdominal pain related to eating has persisted. I'm rechecking labs today to include CBC, CMP, celiac panel, lipase, TSH. If the MRI is unrevealing I recommended proceeding to upper endoscopy. Upper endoscopy as discussed and she was agreeable to proceed. We'll give her prescription for Levsin to be used as needed and as directed for abdominal cramping   2.  CRC screening -- she is due in 2014 for colonoscopy. She has requested that this test be done at the same time as her upper endoscopy. After discussing the test she is agreeable to proceed and we will perform it on the same day as her EGD. It EGD is not needed after her MRI results, we still will plan to proceed with colonoscopy for screening

## 2012-11-04 NOTE — Patient Instructions (Addendum)
You have been scheduled for a colonoscopy with propofol. Please follow written instructions given to you at your visit today.  Please pick up your prep kit at the pharmacy within the next 1-3 days. If you use inhalers (even only as needed) or a CPAP machine, please bring them with you on the day of your procedure.  Your physician has requested that you go to the basement for lab work before leaving today.  You have been scheduled for an MRI W/MRCP  On 11/09/2012   At  8:00am Please arrive at 7:45am Nothing to eat or drink 4 hours prior to your appointment

## 2012-11-05 LAB — TISSUE TRANSGLUTAMINASE, IGA: Tissue Transglutaminase Ab, IgA: 7.6 U/mL (ref <20)

## 2012-11-09 ENCOUNTER — Ambulatory Visit (HOSPITAL_COMMUNITY): Admission: RE | Admit: 2012-11-09 | Payer: BC Managed Care – PPO | Source: Ambulatory Visit

## 2012-11-11 ENCOUNTER — Ambulatory Visit (HOSPITAL_COMMUNITY)
Admission: RE | Admit: 2012-11-11 | Discharge: 2012-11-11 | Disposition: A | Discharge status: Home / Self Care | Payer: BC Managed Care – PPO | Source: Ambulatory Visit | Attending: Internal Medicine | Admitting: Internal Medicine

## 2012-11-11 ENCOUNTER — Other Ambulatory Visit: Payer: Self-pay | Admitting: Internal Medicine

## 2012-11-11 DIAGNOSIS — R935 Abnormal findings on diagnostic imaging of other abdominal regions, including retroperitoneum: Secondary | ICD-10-CM | POA: Insufficient documentation

## 2012-11-11 DIAGNOSIS — R52 Pain, unspecified: Secondary | ICD-10-CM

## 2012-11-11 DIAGNOSIS — R109 Unspecified abdominal pain: Secondary | ICD-10-CM | POA: Insufficient documentation

## 2012-11-11 DIAGNOSIS — Z1211 Encounter for screening for malignant neoplasm of colon: Secondary | ICD-10-CM

## 2012-11-11 DIAGNOSIS — K7689 Other specified diseases of liver: Secondary | ICD-10-CM | POA: Insufficient documentation

## 2012-11-11 DIAGNOSIS — N281 Cyst of kidney, acquired: Secondary | ICD-10-CM | POA: Insufficient documentation

## 2012-11-11 DIAGNOSIS — R1010 Upper abdominal pain, unspecified: Secondary | ICD-10-CM

## 2012-11-11 MED ORDER — GADOBENATE DIMEGLUMINE 529 MG/ML IV SOLN
17.0000 mL | Freq: Once | INTRAVENOUS | Status: AC | PRN
  Administered 2012-11-11: 17 mL via INTRAVENOUS

## 2012-11-15 ENCOUNTER — Telehealth: Payer: Self-pay | Admitting: *Deleted

## 2012-11-15 NOTE — Telephone Encounter (Signed)
Informed pt of labs and MRI results and that Dr Rhea Belton feels she should see a surgeon, Dr Dwain Sarna for consultation. Pt stated understanding and since she works nights and lives 45 minutes away, she prefers an early am before she goes home or a late afternoon around 3 or 4 after she gets up. Dr Rhea Belton, pt wants to know if she still needs the EGD since you found "something" with the MRI? Thanks.

## 2012-11-15 NOTE — Telephone Encounter (Signed)
Message copied by Florene Glen on Mon Nov 15, 2012  9:36 AM ------      Message from: Beverley Fiedler      Created: Mon Nov 08, 2012  3:14 PM       Labs reviewed, celiac panel is negative. Thyroid is normal, blood counts are normal      Awaiting MRI results ------

## 2012-11-15 NOTE — Telephone Encounter (Signed)
I am okay with canceling the EGD for now, however if Dr. Dwain Sarna agrees in the need for cholecystectomy and symptoms persist, then she will need EGD She still needs colonoscopy for surveillance/screening

## 2012-11-15 NOTE — Telephone Encounter (Signed)
Scheduled pt to see Dr Dwain Sarna, but the appt isn't until after pt has her COLON. She will call me in the am per her husband.

## 2012-11-16 NOTE — Telephone Encounter (Signed)
Pt called me back this am and I missed her before she went to bed. I spoke with her husband and I will call her back in the am between 8:15 and 8:30am.

## 2012-11-17 ENCOUNTER — Encounter: Payer: Self-pay | Admitting: Internal Medicine

## 2012-11-17 NOTE — Telephone Encounter (Signed)
Informed pt of appt with Dr Dwain Sarna and that depending on his visit, she may need to schedule the EGD later- do COLON only. Pt is OK with this, mailed her a calendar.

## 2012-12-02 ENCOUNTER — Ambulatory Visit (AMBULATORY_SURGERY_CENTER): Payer: BC Managed Care – PPO | Admitting: Internal Medicine

## 2012-12-02 ENCOUNTER — Ambulatory Visit (INDEPENDENT_AMBULATORY_CARE_PROVIDER_SITE_OTHER): Payer: Self-pay | Admitting: General Surgery

## 2012-12-02 ENCOUNTER — Encounter: Payer: Self-pay | Admitting: Internal Medicine

## 2012-12-02 VITALS — BP 116/76 | HR 73 | Temp 97.1°F | Resp 16 | Ht 63.0 in | Wt 183.0 lb

## 2012-12-02 DIAGNOSIS — Z1211 Encounter for screening for malignant neoplasm of colon: Secondary | ICD-10-CM

## 2012-12-02 DIAGNOSIS — Z83719 Family history of colon polyps, unspecified: Secondary | ICD-10-CM

## 2012-12-02 DIAGNOSIS — R109 Unspecified abdominal pain: Secondary | ICD-10-CM

## 2012-12-02 DIAGNOSIS — Z8371 Family history of colonic polyps: Secondary | ICD-10-CM

## 2012-12-02 MED ORDER — SODIUM CHLORIDE 0.9 % IV SOLN: 500.0000 mL | INTRAVENOUS | Status: DC

## 2012-12-02 NOTE — Patient Instructions (Addendum)

## 2012-12-02 NOTE — Progress Notes (Signed)
Patient did not experience any of the following events: a burn prior to discharge; a fall within the facility; wrong site/side/patient/procedure/implant event; or a hospital transfer or hospital admission upon discharge from the facility. (G8907) Patient did not have preoperative order for IV antibiotic SSI prophylaxis. (G8918)  

## 2012-12-02 NOTE — Op Note (Signed)
[facility] [address] [place], [address]   COLONOSCOPY PROCEDURE REPORT   PATIENT: [name], [name]  MR#: [id number] BIRTHDATE: Apr 15, 1951 , 61  yrs. old GENDER: Female ENDOSCOPIST: Beverley Fiedler, MD REFERRED UV:[id number], Ravisankar PROCEDURE DATE:  12/02/2012 PROCEDURE:   Colonoscopy, screening ASA CLASS:   Class II INDICATIONS:elevated risk screening and Patient's family history of colon polyps. MEDICATIONS: MAC sedation, administered by CRNA and propofol (Diprivan) 450mg  IV  DESCRIPTION OF PROCEDURE:   After the risks benefits and alternatives of the procedure were thoroughly explained, informed consent was obtained.  A digital rectal exam revealed no rectal mass.   The LB [id number]  endoscope was introduced through the anus and advanced to the cecum, which was identified by both the appendix and ileocecal valve. No adverse events experienced. The quality of the prep was good, using MoviPrep  The instrument was then slowly withdrawn as the colon was fully examined.      COLON FINDINGS: A normal appearing cecum, ileocecal valve, and appendiceal orifice were identified.  The ascending, hepatic flexure, transverse, splenic flexure, descending, sigmoid colon and rectum appeared unremarkable.  No polyps or cancers were seen.  The colon was redundant requiring change of position to supine and manual pressure to reach the cecum.  Small internal hemorrhoids were found.  Retroflexion was not performed due to a narrow rectal vault.      The scope was withdrawn and the procedure completed.  COMPLICATIONS: There were no complications.  ENDOSCOPIC IMPRESSION: 1.   Normal colon 2.   Small internal hemorrhoids  RECOMMENDATIONS: Repeat Colonoscopy in 5 years due to reported family history of precancerous polyps.   eSigned:  Beverley Fiedler, MD 12/02/2012 3:55 PM    cc: Chilton Greathouse, MD and The Patient

## 2012-12-03 ENCOUNTER — Telehealth: Payer: Self-pay | Admitting: *Deleted

## 2012-12-03 ENCOUNTER — Ambulatory Visit (INDEPENDENT_AMBULATORY_CARE_PROVIDER_SITE_OTHER): Payer: BC Managed Care – PPO | Admitting: General Surgery

## 2012-12-03 ENCOUNTER — Encounter (INDEPENDENT_AMBULATORY_CARE_PROVIDER_SITE_OTHER): Payer: Self-pay | Admitting: General Surgery

## 2012-12-03 VITALS — BP 134/80 | HR 68 | Temp 97.6°F | Resp 16 | Ht 64.0 in | Wt 181.0 lb

## 2012-12-03 DIAGNOSIS — K829 Disease of gallbladder, unspecified: Secondary | ICD-10-CM

## 2012-12-03 NOTE — Telephone Encounter (Signed)
  Follow up Call-  Call back number 12/02/2012  Post procedure Call Back phone  # [phone] hm  Permission to leave phone message Yes     Patient questions:  Do you have a fever, pain , or abdominal swelling? no Pain Score  0 *  Have you tolerated food without any problems? yes  Have you been able to return to your normal activities? yes  Do you have any questions about your discharge instructions: Diet   no Medications  no Follow up visit  no  Do you have questions or concerns about your Care? no  Actions: * If pain score is 4 or above: No action needed, pain <4.

## 2012-12-05 NOTE — Progress Notes (Signed)
Patient ID: [name], female   DOB: 1951/05/17, 62 y.o.   MRN: [id number]  Chief Complaint  Patient presents with  . New Evaluation    eval for cholelithiasis    HPI [name] is a 62 y.o. female.  Referred by Dr Erick Blinks HPI 40 yof who presents after sustaining a significant episode of nausea in January that required a trip to the ER.  Her prilosec was increased to bid and she thinks that helped.  She has also been having upper abdominal pain especially after eating.  She underwent U/S that showed multiple septations in the gb.  This was followed by an MR that showed gallbladder adenomyomatosis.  Last 2-3 weeks she has changed her diet and this is much better now.  She has some occasional diarrhea also.  She also has bloating. Levsin has not helped.  She describes pain when she has it as RUQ pain that radiates to her back.  Past Medical History  Diagnosis Date  . Hypertension   . H/O colonoscopy     5 14 09 Dr Randa Evens  . GERD (gastroesophageal reflux disease)   . Allergic rhinitis   . Spinal stenosis   . Irregular heart beats   . Anxiety   . Depression   . IBS (irritable bowel syndrome)   . Migraine     Past Surgical History  Procedure Laterality Date  . Vaginal hysterectomy    . Colonoscopy    . Tonsillectomy    . Wisdom tooth extraction      Family History  Problem Relation Age of Onset  . Uterine cancer Mother   . Colon polyps Mother   . Heart disease Mother   . Irritable bowel syndrome Mother   . Cancer Mother     uterine  . Hypertension Mother   . COPD Mother   . Colon cancer Neg Hx   . Esophageal cancer Neg Hx   . Rectal cancer Neg Hx   . Stomach cancer Neg Hx   . Irritable bowel syndrome Maternal Aunt   . Irritable bowel syndrome Cousin     x 4  . Cancer Cousin     breast  . Emphysema Father   . Hypertension Father   . Alcohol abuse Father   . COPD Brother   . Diabetes Brother   . Cancer Paternal Grandmother     breast    Social  History History  Substance Use Topics  . Smoking status: Never Smoker   . Smokeless tobacco: Never Used  . Alcohol Use: Yes     Comment: very rare    No Known Allergies  Current Outpatient Prescriptions  Medication Sig Dispense Refill  . buPROPion (WELLBUTRIN SR) 100 MG 12 hr tablet Take 100 mg by mouth daily.      . hyoscyamine (LEVSIN SL) 0.125 MG SL tablet Place 1 tablet (0.125 mg total) under the tongue every 4 (four) hours as needed for cramping.  30 tablet  0  . irbesartan (AVAPRO) 150 MG tablet Take 75 mg by mouth at bedtime. Taking a half Tab      . omeprazole (PRILOSEC) 40 MG capsule Take 40 mg by mouth 2 (two) times daily.      . promethazine (PHENERGAN) 25 MG tablet Take 1 tablet (25 mg total) by mouth every 6 (six) hours as needed for nausea.  20 tablet  0   No current facility-administered medications for this visit.    Review of Systems Review  of Systems  Constitutional: Negative for fever, chills and unexpected weight change.  HENT: Positive for sinus pressure. Negative for hearing loss, congestion, sore throat, trouble swallowing and voice change.   Eyes: Negative for visual disturbance.  Respiratory: Positive for cough. Negative for wheezing.   Cardiovascular: Negative for chest pain, palpitations and leg swelling.  Gastrointestinal: Positive for nausea and abdominal pain. Negative for vomiting, diarrhea, constipation, blood in stool, abdominal distention and anal bleeding.  Genitourinary: Negative for hematuria, vaginal bleeding and difficulty urinating.  Musculoskeletal: Positive for arthralgias.  Skin: Negative for rash and wound.  Neurological: Positive for headaches. Negative for seizures and syncope.  Hematological: Negative for adenopathy. Does not bruise/bleed easily.  Psychiatric/Behavioral: Negative for confusion.    Blood pressure 134/80, pulse 68, temperature 97.6 F (36.4 C), temperature source Temporal, resp. rate 16, height 5\' 4"  (1.626 m),  weight 181 lb (82.101 kg).  Physical Exam Physical Exam  Vitals reviewed. Constitutional: She appears well-developed and well-nourished.  Eyes: No scleral icterus.  Cardiovascular: Normal rate, regular rhythm and normal heart sounds.   Pulmonary/Chest: Effort normal. She has no wheezes. She has no rales.  Abdominal: Soft. Bowel sounds are normal. There is no tenderness.  Lymphadenopathy:    She has no cervical adenopathy.    Data Reviewed U/S and MRI reviewed  Assessment    GB adenomyomatosis, biliary colic    Plan    She does not have stones but does have abnl u/s and mri associated with symptoms that I think correlate to her gb.  I told her that I think cholecystectomy will relieve these symptoms but this is not urgent as she doesn't have stone disease.  She would like to organize her work before we proceed.   I discussed the procedure in detail.   We discussed the risks and benefits of a laparoscopic cholecystectomy and possible cholangiogram including, but not limited to bleeding, infection, injury to surrounding structures such as the intestine or liver, bile leak, retained gallstones, need to convert to an open procedure, prolonged diarrhea, blood clots such as  DVT, common bile duct injury, anesthesia risks, and possible need for additional procedures.  The likelihood of improvement in symptoms and return to the patient's normal status is good. We discussed the typical post-operative recovery course.        WAKEFIELD,MATTHEW 12/05/2012, 7:36 PM

## 2012-12-27 ENCOUNTER — Encounter: Payer: Self-pay | Admitting: *Deleted

## 2012-12-28 ENCOUNTER — Telehealth: Payer: Self-pay | Admitting: Internal Medicine

## 2012-12-28 NOTE — Telephone Encounter (Signed)
Dr. Vicente Males Office requested Consult Notes from 10/22/2012 on 12/20/2012  Faxed 8 pages Office Note from 11/04/2012 and Colon Report from 12/02/2012 on 12/21/2012   asw

## 2013-05-05 ENCOUNTER — Telehealth: Payer: Self-pay | Admitting: *Deleted

## 2013-05-05 NOTE — Telephone Encounter (Signed)
Message copied by Florene Glen on Thu May 05, 2013  2:49 PM ------      Message from: Beecher Mcardle      Created: Thu May 05, 2013  2:29 PM       Did we do the letter? I never got a notice that it was done. If you will let me know Anabelle Trisha Mangle needs it to go with the claim to insurance company.       Barb      ----- Message -----         From: Linna Hoff, RN         Sent: 05/05/2013   2:24 PM           To: Eula Flax, MD            What's the latest on this? Pt called Janelle about it stating insurance has denied the claim or something. Thanks.      ----- Message -----         From: Beverley Fiedler, MD         Sent: 04/25/2013  10:49 AM           To: Otilio Carpen, RN            Orbie Hurst and I were discussing the letter of medical necessity for this patient      She was brought for screening colonoscopy in 2014 for family history of adenomatous colon polyps. She had a colonoscopy in 2009 with a recommended five-year repeat, which I performed      Do you need a letter stating that colonoscopy was performed for screening, elevated risk given family history of precancerous polyps?      Thanks for the clarification      JMP            ----- Message -----         From: Linna Hoff, RN         Sent: 04/25/2013  10:28 AM           To: Beverley Fiedler, MD            No, per 11/04/12 OV she was to have an ECL, but if MRI ws OK she needs a COLON for screening only. That's what I see. Medical necessity?      ----- Message -----         From: Beverley Fiedler, MD         Sent: 04/25/2013   9:19 AM           To: Linna Hoff, RN            I know you are working, but have we addressed this issue yet?      JMP            ----- Message -----         From: Beecher Mcardle         Sent: 04/12/2013   1:00 PM           To: Beverley Fiedler, MD            Sorry, for the colon. txs      ----- Message -----         From: Beverley Fiedler, MD         Sent: 04/12/2013  12:54  PM           To: Beecher Mcardle            For the egd or colon?            ----- Message -----         From: Beecher Mcardle         Sent: 04/12/2013  12:47 PM           To: Beverley Fiedler, MD            Dr. Rhea Belton, Good afternoon, I know you are getting tired of hearing from me today. Sorry.Marland KitchenMarland KitchenMarland KitchenWould you please write a letter of medical necessity for the procedure done 12/02/2012. Let me know when done and I will get it to the correct person. The insurance is requesting.            Thank you,            Barb                                           ------

## 2015-06-29 ENCOUNTER — Telehealth: Payer: Self-pay | Admitting: Internal Medicine

## 2015-06-29 NOTE — Telephone Encounter (Signed)
Did not need this encounter °

## 2015-08-09 ENCOUNTER — Emergency Department (HOSPITAL_COMMUNITY)
Admission: EM | Admit: 2015-08-09 | Discharge: 2015-08-09 | Disposition: A | Discharge status: Home / Self Care | Payer: 59 | Attending: Emergency Medicine | Admitting: Emergency Medicine

## 2015-08-09 ENCOUNTER — Encounter (HOSPITAL_COMMUNITY): Payer: Self-pay

## 2015-08-09 ENCOUNTER — Emergency Department (HOSPITAL_COMMUNITY): Payer: 59

## 2015-08-09 DIAGNOSIS — I10 Essential (primary) hypertension: Secondary | ICD-10-CM | POA: Insufficient documentation

## 2015-08-09 DIAGNOSIS — G43909 Migraine, unspecified, not intractable, without status migrainosus: Secondary | ICD-10-CM | POA: Diagnosis not present

## 2015-08-09 DIAGNOSIS — S4992XA Unspecified injury of left shoulder and upper arm, initial encounter: Secondary | ICD-10-CM | POA: Diagnosis not present

## 2015-08-09 DIAGNOSIS — K219 Gastro-esophageal reflux disease without esophagitis: Secondary | ICD-10-CM | POA: Diagnosis not present

## 2015-08-09 DIAGNOSIS — Y998 Other external cause status: Secondary | ICD-10-CM | POA: Diagnosis not present

## 2015-08-09 DIAGNOSIS — Z8739 Personal history of other diseases of the musculoskeletal system and connective tissue: Secondary | ICD-10-CM | POA: Diagnosis not present

## 2015-08-09 DIAGNOSIS — Y92410 Unspecified street and highway as the place of occurrence of the external cause: Secondary | ICD-10-CM | POA: Insufficient documentation

## 2015-08-09 DIAGNOSIS — F419 Anxiety disorder, unspecified: Secondary | ICD-10-CM | POA: Diagnosis not present

## 2015-08-09 DIAGNOSIS — Y9389 Activity, other specified: Secondary | ICD-10-CM | POA: Insufficient documentation

## 2015-08-09 DIAGNOSIS — F329 Major depressive disorder, single episode, unspecified: Secondary | ICD-10-CM | POA: Insufficient documentation

## 2015-08-09 DIAGNOSIS — Z79899 Other long term (current) drug therapy: Secondary | ICD-10-CM | POA: Diagnosis not present

## 2015-08-09 DIAGNOSIS — K589 Irritable bowel syndrome without diarrhea: Secondary | ICD-10-CM | POA: Insufficient documentation

## 2015-08-09 DIAGNOSIS — S6991XA Unspecified injury of right wrist, hand and finger(s), initial encounter: Secondary | ICD-10-CM | POA: Insufficient documentation

## 2015-08-09 MED ORDER — IBUPROFEN 600 MG PO TABS: 600.0000 mg | ORAL_TABLET | Freq: Four times a day (QID) | ORAL | Status: DC | PRN

## 2015-08-09 MED ORDER — CYCLOBENZAPRINE HCL 10 MG PO TABS
10.0000 mg | ORAL_TABLET | Freq: Once | ORAL | Status: AC
  Administered 2015-08-09: 10 mg via ORAL
  Filled 2015-08-09: qty 1

## 2015-08-09 MED ORDER — CYCLOBENZAPRINE HCL 10 MG PO TABS: 10.0000 mg | ORAL_TABLET | Freq: Three times a day (TID) | ORAL | Status: DC | PRN
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
## 2015-11-07 DIAGNOSIS — Z9289 Personal history of other medical treatment: Secondary | ICD-10-CM | POA: Diagnosis not present

## 2015-11-07 DIAGNOSIS — J3089 Other allergic rhinitis: Secondary | ICD-10-CM | POA: Diagnosis not present

## 2015-11-08 DIAGNOSIS — J301 Allergic rhinitis due to pollen: Secondary | ICD-10-CM | POA: Diagnosis not present

## 2015-11-12 DIAGNOSIS — M542 Cervicalgia: Secondary | ICD-10-CM | POA: Diagnosis not present

## 2015-11-12 DIAGNOSIS — Z6832 Body mass index (BMI) 32.0-32.9, adult: Secondary | ICD-10-CM | POA: Diagnosis not present

## 2015-11-29 DIAGNOSIS — M542 Cervicalgia: Secondary | ICD-10-CM | POA: Diagnosis not present

## 2015-11-29 DIAGNOSIS — M47812 Spondylosis without myelopathy or radiculopathy, cervical region: Secondary | ICD-10-CM | POA: Diagnosis not present

## 2016-01-07 DIAGNOSIS — M542 Cervicalgia: Secondary | ICD-10-CM | POA: Diagnosis not present

## 2016-01-07 DIAGNOSIS — M545 Low back pain: Secondary | ICD-10-CM | POA: Diagnosis not present

## 2016-01-09 ENCOUNTER — Other Ambulatory Visit: Payer: Self-pay | Admitting: Neurological Surgery

## 2016-01-09 DIAGNOSIS — M545 Low back pain: Secondary | ICD-10-CM

## 2016-01-17 ENCOUNTER — Ambulatory Visit
Admission: RE | Admit: 2016-01-17 | Discharge: 2016-01-17 | Disposition: A | Discharge status: Home / Self Care | Payer: Medicare Other | Source: Ambulatory Visit | Attending: Neurological Surgery | Admitting: Neurological Surgery

## 2016-01-17 DIAGNOSIS — M4806 Spinal stenosis, lumbar region: Secondary | ICD-10-CM | POA: Diagnosis not present

## 2016-01-17 DIAGNOSIS — M545 Low back pain: Secondary | ICD-10-CM

## 2016-01-17 MED ORDER — GADOBENATE DIMEGLUMINE 529 MG/ML IV SOLN
19.0000 mL | Freq: Once | INTRAVENOUS | Status: AC | PRN
  Administered 2016-01-17: 19 mL via INTRAVENOUS

## 2016-01-25 DIAGNOSIS — M4316 Spondylolisthesis, lumbar region: Secondary | ICD-10-CM | POA: Diagnosis not present

## 2016-01-25 DIAGNOSIS — Z6833 Body mass index (BMI) 33.0-33.9, adult: Secondary | ICD-10-CM | POA: Diagnosis not present

## 2016-03-03 DIAGNOSIS — M4316 Spondylolisthesis, lumbar region: Secondary | ICD-10-CM | POA: Diagnosis not present

## 2016-03-03 DIAGNOSIS — M545 Low back pain: Secondary | ICD-10-CM | POA: Diagnosis not present

## 2016-03-24 DIAGNOSIS — H40023 Open angle with borderline findings, high risk, bilateral: Secondary | ICD-10-CM | POA: Diagnosis not present

## 2016-03-24 DIAGNOSIS — M4316 Spondylolisthesis, lumbar region: Secondary | ICD-10-CM | POA: Diagnosis not present

## 2016-04-10 DIAGNOSIS — J309 Allergic rhinitis, unspecified: Secondary | ICD-10-CM | POA: Diagnosis not present

## 2016-04-30 DIAGNOSIS — E784 Other hyperlipidemia: Secondary | ICD-10-CM | POA: Diagnosis not present

## 2016-04-30 DIAGNOSIS — I10 Essential (primary) hypertension: Secondary | ICD-10-CM | POA: Diagnosis not present

## 2016-05-07 DIAGNOSIS — R5383 Other fatigue: Secondary | ICD-10-CM | POA: Diagnosis not present

## 2016-05-07 DIAGNOSIS — G43909 Migraine, unspecified, not intractable, without status migrainosus: Secondary | ICD-10-CM | POA: Diagnosis not present

## 2016-05-07 DIAGNOSIS — Z6833 Body mass index (BMI) 33.0-33.9, adult: Secondary | ICD-10-CM | POA: Diagnosis not present

## 2016-05-07 DIAGNOSIS — Z23 Encounter for immunization: Secondary | ICD-10-CM | POA: Diagnosis not present

## 2016-05-07 DIAGNOSIS — Z0000 Encounter for general adult medical examination without abnormal findings: Secondary | ICD-10-CM | POA: Diagnosis not present

## 2016-05-07 DIAGNOSIS — J309 Allergic rhinitis, unspecified: Secondary | ICD-10-CM | POA: Diagnosis not present

## 2016-05-07 DIAGNOSIS — F39 Unspecified mood [affective] disorder: Secondary | ICD-10-CM | POA: Diagnosis not present

## 2016-05-07 DIAGNOSIS — K219 Gastro-esophageal reflux disease without esophagitis: Secondary | ICD-10-CM | POA: Diagnosis not present

## 2016-05-07 DIAGNOSIS — E784 Other hyperlipidemia: Secondary | ICD-10-CM | POA: Diagnosis not present

## 2016-05-07 DIAGNOSIS — I10 Essential (primary) hypertension: Secondary | ICD-10-CM | POA: Diagnosis not present

## 2016-05-07 DIAGNOSIS — M4800 Spinal stenosis, site unspecified: Secondary | ICD-10-CM | POA: Diagnosis not present

## 2016-05-09 DIAGNOSIS — Z1212 Encounter for screening for malignant neoplasm of rectum: Secondary | ICD-10-CM | POA: Diagnosis not present

## 2016-06-14 DIAGNOSIS — Z23 Encounter for immunization: Secondary | ICD-10-CM | POA: Diagnosis not present

## 2016-06-19 DIAGNOSIS — Z6832 Body mass index (BMI) 32.0-32.9, adult: Secondary | ICD-10-CM | POA: Diagnosis not present

## 2016-06-19 DIAGNOSIS — Z1231 Encounter for screening mammogram for malignant neoplasm of breast: Secondary | ICD-10-CM | POA: Diagnosis not present

## 2016-06-19 DIAGNOSIS — Z01419 Encounter for gynecological examination (general) (routine) without abnormal findings: Secondary | ICD-10-CM | POA: Diagnosis not present

## 2016-08-07 DIAGNOSIS — J3089 Other allergic rhinitis: Secondary | ICD-10-CM | POA: Diagnosis not present

## 2016-08-27 DIAGNOSIS — Z6832 Body mass index (BMI) 32.0-32.9, adult: Secondary | ICD-10-CM | POA: Diagnosis not present

## 2016-08-27 DIAGNOSIS — F39 Unspecified mood [affective] disorder: Secondary | ICD-10-CM | POA: Diagnosis not present

## 2016-08-27 DIAGNOSIS — R5383 Other fatigue: Secondary | ICD-10-CM | POA: Diagnosis not present

## 2016-08-27 DIAGNOSIS — E784 Other hyperlipidemia: Secondary | ICD-10-CM | POA: Diagnosis not present

## 2016-08-27 DIAGNOSIS — K219 Gastro-esophageal reflux disease without esophagitis: Secondary | ICD-10-CM | POA: Diagnosis not present

## 2016-08-27 DIAGNOSIS — I10 Essential (primary) hypertension: Secondary | ICD-10-CM | POA: Diagnosis not present

## 2016-09-04 ENCOUNTER — Encounter: Payer: Self-pay | Admitting: Neurology

## 2016-09-04 ENCOUNTER — Ambulatory Visit (INDEPENDENT_AMBULATORY_CARE_PROVIDER_SITE_OTHER): Payer: Medicare Other | Admitting: Neurology

## 2016-09-04 VITALS — BP 118/65 | HR 58 | Resp 20 | Ht 64.0 in | Wt 186.0 lb

## 2016-09-04 DIAGNOSIS — R400 Somnolence: Secondary | ICD-10-CM

## 2016-09-04 DIAGNOSIS — M79605 Pain in left leg: Secondary | ICD-10-CM

## 2016-09-04 DIAGNOSIS — F39 Unspecified mood [affective] disorder: Secondary | ICD-10-CM

## 2016-09-04 DIAGNOSIS — R058 Other specified cough: Secondary | ICD-10-CM

## 2016-09-04 DIAGNOSIS — R05 Cough: Secondary | ICD-10-CM

## 2016-09-04 DIAGNOSIS — M79604 Pain in right leg: Secondary | ICD-10-CM | POA: Diagnosis not present

## 2016-09-04 DIAGNOSIS — R0683 Snoring: Secondary | ICD-10-CM

## 2016-09-04 NOTE — Patient Instructions (Signed)
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
## 2016-09-16 ENCOUNTER — Ambulatory Visit (INDEPENDENT_AMBULATORY_CARE_PROVIDER_SITE_OTHER): Payer: Medicare Other | Admitting: Neurology

## 2016-09-16 DIAGNOSIS — G4733 Obstructive sleep apnea (adult) (pediatric): Secondary | ICD-10-CM | POA: Diagnosis not present

## 2016-09-16 DIAGNOSIS — G4761 Periodic limb movement disorder: Secondary | ICD-10-CM

## 2016-09-16 DIAGNOSIS — G4720 Circadian rhythm sleep disorder, unspecified type: Secondary | ICD-10-CM
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
## 2016-09-23 NOTE — Progress Notes (Signed)
Patient referred by Dr. Dagmar Hait, seen by me on 09/04/16, diagnostic PSG on 09/16/16.   Please call and notify the patient that the recent sleep study did not show any significant obstructive sleep apnea. She had an overall normal AHI and evidence of supine obstructive sleep apnea with a total AHI of 2.9/hour, REM AHI of 2.1/hour, supine AHI of 22.3/hour and O2 nadir of 83% during supine sleep. Positive airway pressure treatment is not warranted and avoidance of supine sleep position along with weight loss is recommended. For disturbing degree of snoring, an oral appliance can be considered. Mild or borderline leg kicking noted, but not disruptive to sleep. Please inform patient that I would like to go over the details of the study during a follow up appointment. Arrange a followup appointment. Also, route or fax report to PCP and referring MD, if other than PCP.  Once you have spoken to patient, you can close this encounter.   Thanks,  Star Age, MD, PhD [facility])

## 2016-09-26 ENCOUNTER — Telehealth: Payer: Self-pay

## 2016-09-26 NOTE — Telephone Encounter (Signed)
LM for patient to call back for results

## 2016-09-26 NOTE — Telephone Encounter (Signed)
-----   Message from Star Age, MD sent at 09/23/2016  1:15 PM EST ----- Patient referred by Dr. Dagmar Hait, seen by me on 09/04/16, diagnostic PSG on 09/16/16.   Please call and notify the patient that the recent sleep study did not show any significant obstructive sleep apnea. She had an overall normal AHI and evidence of supine obstructive sleep apnea with a total AHI of 2.9/hour, REM AHI of 2.1/hour, supine AHI of 22.3/hour and O2 nadir of 83% during supine sleep. Positive airway pressure treatment is not warranted and avoidance of supine sleep position along with weight loss is recommended. For disturbing degree of snoring, an oral appliance can be considered. Mild or borderline leg kicking noted, but not disruptive to sleep. Please inform patient that I would like to go over the details of the study during a follow up appointment. Arrange a followup appointment. Also, route or fax report to PCP and referring MD, if other than PCP.  Once you have spoken to patient, you can close this encounter.   Thanks,  Star Age, MD, PhD [facility])

## 2016-09-29 NOTE — Telephone Encounter (Signed)
I spoke to patient and she is aware of results. Report sent to PCP. I was able to make patient an appt in February.

## 2016-09-29 NOTE — Telephone Encounter (Signed)
Pt returned RN's call. She is requesting to call [phone]

## 2016-10-24 DIAGNOSIS — E784 Other hyperlipidemia: Secondary | ICD-10-CM | POA: Diagnosis not present

## 2016-10-28 ENCOUNTER — Ambulatory Visit (INDEPENDENT_AMBULATORY_CARE_PROVIDER_SITE_OTHER): Payer: Medicare Other | Admitting: Neurology

## 2016-10-28 ENCOUNTER — Encounter: Payer: Self-pay | Admitting: Neurology

## 2016-10-28 VITALS — BP 108/65 | HR 65 | Resp 20 | Ht 64.0 in | Wt 187.0 lb

## 2016-10-28 DIAGNOSIS — G4733 Obstructive sleep apnea (adult) (pediatric): Secondary | ICD-10-CM | POA: Diagnosis not present

## 2016-10-28 DIAGNOSIS — G479 Sleep disorder, unspecified: Secondary | ICD-10-CM | POA: Diagnosis not present

## 2016-10-28 DIAGNOSIS — G4761 Periodic limb movement disorder: Secondary | ICD-10-CM | POA: Diagnosis not present

## 2016-10-28 NOTE — Patient Instructions (Signed)
Your sleep study did not show any significant obstructive sleep apnea, except for mild OSA whiles sleeping on your back. Please avoid sleeping on your back (which you do anyway) and strive for weight loss.   You had mild or borderline leg twitching in sleep, which did not disturb your sleep very much. Could be medication effect.  Please remember to try to maintain good sleep hygiene, which means: Keep a regular sleep and wake schedule, try not to exercise or have a meal within 2 hours of your bedtime, try to keep your bedroom conducive for sleep, that is, cool and dark, without light distractors such as an illuminated alarm clock, and refrain from watching TV right before sleep or in the middle of the night and do not keep the TV or radio on during the night. Also, try not to use or play on electronic devices at bedtime, such as your cell phone, tablet PC or laptop. If you like to read at bedtime on an electronic device, try to dim the background light as much as possible. Do not eat in the middle of the night.   I will see you back as needed. Keep your follow up with Dr. Dagmar Hait.
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
## 2016-10-30 DIAGNOSIS — I10 Essential (primary) hypertension: Secondary | ICD-10-CM | POA: Diagnosis not present

## 2016-10-30 DIAGNOSIS — K120 Recurrent oral aphthae: Secondary | ICD-10-CM | POA: Diagnosis not present

## 2016-11-03 DIAGNOSIS — Z6833 Body mass index (BMI) 33.0-33.9, adult: Secondary | ICD-10-CM | POA: Diagnosis not present

## 2016-11-03 DIAGNOSIS — M4802 Spinal stenosis, cervical region: Secondary | ICD-10-CM | POA: Diagnosis not present

## 2016-11-07 ENCOUNTER — Other Ambulatory Visit: Payer: Self-pay | Admitting: Neurological Surgery

## 2016-11-07 DIAGNOSIS — M4802 Spinal stenosis, cervical region: Secondary | ICD-10-CM

## 2016-12-02 ENCOUNTER — Ambulatory Visit
Admission: RE | Admit: 2016-12-02 | Discharge: 2016-12-02 | Disposition: A | Discharge status: Home / Self Care | Payer: Medicare Other | Source: Ambulatory Visit | Attending: Neurological Surgery | Admitting: Neurological Surgery

## 2016-12-02 DIAGNOSIS — M4802 Spinal stenosis, cervical region: Secondary | ICD-10-CM

## 2016-12-17 DIAGNOSIS — J3089 Other allergic rhinitis: Secondary | ICD-10-CM | POA: Diagnosis not present

## 2016-12-30 DIAGNOSIS — Z6833 Body mass index (BMI) 33.0-33.9, adult: Secondary | ICD-10-CM | POA: Diagnosis not present

## 2016-12-30 DIAGNOSIS — M47812 Spondylosis without myelopathy or radiculopathy, cervical region: Secondary | ICD-10-CM | POA: Diagnosis not present

## 2017-01-08 ENCOUNTER — Ambulatory Visit (INDEPENDENT_AMBULATORY_CARE_PROVIDER_SITE_OTHER): Payer: Medicare Other | Admitting: Orthopaedic Surgery

## 2017-01-08 ENCOUNTER — Ambulatory Visit (INDEPENDENT_AMBULATORY_CARE_PROVIDER_SITE_OTHER): Payer: Medicare Other

## 2017-01-08 ENCOUNTER — Encounter (INDEPENDENT_AMBULATORY_CARE_PROVIDER_SITE_OTHER): Payer: Self-pay | Admitting: Orthopaedic Surgery

## 2017-01-08 VITALS — BP 138/72 | HR 72 | Ht 64.0 in | Wt 190.0 lb

## 2017-01-08 DIAGNOSIS — G8929 Other chronic pain: Secondary | ICD-10-CM | POA: Diagnosis not present

## 2017-01-08 DIAGNOSIS — M7582 Other shoulder lesions, left shoulder: Secondary | ICD-10-CM

## 2017-01-08 DIAGNOSIS — M25512 Pain in left shoulder: Secondary | ICD-10-CM

## 2017-01-08 DIAGNOSIS — M7542 Impingement syndrome of left shoulder: Secondary | ICD-10-CM

## 2017-01-08 MED ORDER — LIDOCAINE HCL 1 % IJ SOLN
2.0000 mL | INTRAMUSCULAR | Status: AC | PRN
  Administered 2017-01-08: 2 mL

## 2017-01-08 MED ORDER — BUPIVACAINE HCL 0.5 % IJ SOLN
2.0000 mL | INTRAMUSCULAR | Status: AC | PRN
  Administered 2017-01-08: 2 mL via INTRA_ARTICULAR

## 2017-01-08 MED ORDER — METHYLPREDNISOLONE ACETATE 40 MG/ML IJ SUSP
80.0000 mg | INTRAMUSCULAR | Status: AC | PRN
  Administered 2017-01-08: 80 mg
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
## 2017-01-09 ENCOUNTER — Ambulatory Visit (INDEPENDENT_AMBULATORY_CARE_PROVIDER_SITE_OTHER): Payer: Medicare Other | Admitting: Orthopaedic Surgery

## 2017-01-21 ENCOUNTER — Encounter: Payer: Self-pay | Admitting: *Deleted

## 2017-02-05 ENCOUNTER — Ambulatory Visit (INDEPENDENT_AMBULATORY_CARE_PROVIDER_SITE_OTHER): Payer: Medicare Other | Admitting: Internal Medicine

## 2017-02-05 ENCOUNTER — Encounter: Payer: Self-pay | Admitting: Internal Medicine

## 2017-02-05 VITALS — BP 100/72 | HR 60 | Ht 64.0 in | Wt 187.4 lb

## 2017-02-05 DIAGNOSIS — R05 Cough: Secondary | ICD-10-CM | POA: Diagnosis not present

## 2017-02-05 DIAGNOSIS — D135 Benign neoplasm of extrahepatic bile ducts: Secondary | ICD-10-CM

## 2017-02-05 DIAGNOSIS — K219 Gastro-esophageal reflux disease without esophagitis: Secondary | ICD-10-CM

## 2017-02-05 DIAGNOSIS — R053 Chronic cough: Secondary | ICD-10-CM

## 2017-02-05 MED ORDER — PANTOPRAZOLE SODIUM 40 MG PO TBEC: 40.0000 mg | DELAYED_RELEASE_TABLET | Freq: Two times a day (BID) | ORAL | 3 refills | Status: DC

## 2017-02-05 NOTE — Patient Instructions (Signed)
You have been scheduled for an limited abdominal ultrasound at [facility] (1st floor of hospital) on Tuesday, 02/10/17 at 8:00 am. Please arrive 15 minutes prior to your appointment for registration. Make certain not to have anything to eat or drink 6 hours prior to your appointment. Should you need to reschedule your appointment, please contact radiology at [phone]. This test typically takes about 30 minutes to perform.  You have been scheduled for an endoscopy. Please follow written instructions given to you at your visit today. If you use inhalers (even only as needed), please bring them with you on the day of your procedure. Your physician has requested that you go to www.startemmi.com and enter the access code given to you at your visit today. This web site gives a general overview about your procedure. However, you should still follow specific instructions given to you by our office regarding your preparation for the procedure.  We have sent the following medications to your pharmacy for you to pick up at your convenience: Pantoprazole 40 mg twice daily before meals  Consider head of the bed elevation.  If you are age 66 or older, your body mass index should be between 23-30. Your Body mass index is 32.17 kg/m. If this is out of the aforementioned range listed, please consider follow up with your Primary Care Provider.  If you are age 68 or younger, your body mass index should be between 19-25. Your Body mass index is 32.17 kg/m. If this is out of the aformentioned range listed, please consider follow up with your Primary Care Provider.

## 2017-02-05 NOTE — Progress Notes (Signed)
Subjective:    Patient ID: [name], female    DOB: 1951/02/18, 66 y.o.   MRN: [id number]  HPI [name] is a 66 year old female with a history of GERD, irritable bowel, gallbladder adenomyomatosis who is seen in consultation at the request of Dr. Dagmar Hait to evaluate coughing and burning throat pain. She was seen 4 years ago to evaluate upper abdominal pain with bloating. This evaluation led to an MRI/MRCP due to slightly prominent bile duct seen by ultrasound. MRI/MRCP showed gallbladder adenomyomatosis without intra-or extrahepatic ductal dilatation. Bilateral renal cysts and mild hepatic steatosis. She is referred to Dr. Donne Hazel who offered cholecystectomy but the patient reports she declined due to her work schedule and inability to be off work for the probable 4-6 week recovery period. She reports her abdominal pain has improved in the pain she described previously has not been a problem.  For the past 1-1-1/2 years she has been dealing with coughing, worse at night. This is been treated initially with PPI and eventually pantoprazole 40 mg once daily. This was felt to be "not enough" due to ongoing coughing a better taste in her mouth and nausea. This was increased to 40 mg twice a day which she is currently taking. She felt that this was helpful but less so now. She reports that she is experiencing a burning and tickling sensation mainly in her right throat. This leads to coughing neck tightness and a bitterness in her mouth. She reports she will often cough until she dry heaves which makes her ribs sore. She reports she can cough so much that she feels tearing in her right eye. This does not seem to happen on the left. She is experiencing nausea which can be moderate and last all day. This is not a daily or even weekly occurrence and doesn't seem to be definitively triggered by food. It is not associated with vomiting. Her appetite is reported as "just okay" but she has not lost weight.  Her bowel habits are normal for her which alternate between loose to constipated without blood or melena. When she is feeling more constipated she has issues with abdominal bloating. She reports the primary care performs fecal occult blood testing annually in these of been negative. Her last colonoscopy was in March 2014 which was normal except for small internal hemorrhoids.  She reports she does have an ENT Dr. Hassell Done. She saw him a little over one year ago but recently had a phone visit with him. She is taking allergy injections.  She denies heartburn. She denies dysphagia and odynophagia.  She has previously had an upper GI series in 2014 which showed small hiatal hernia with moderate gastroesophageal reflux. Barium pill passes without delay. No abdomen out of the stomach or duodenum.   Review of Systems As per history of present illness, otherwise negative  Current Medications, Allergies, Past Medical History, Past Surgical History, Family History and Social History were reviewed in [employer] record.     Objective:   Physical Exam BP 100/72   Pulse 60   Ht '5\' 4"'$  (1.626 m)   Wt 187 lb 6.4 oz (85 kg)   BMI 32.17 kg/m  Constitutional: Well-developed and well-nourished. No distress. HEENT: Normocephalic and atraumatic. Oropharynx is clear and moist. Conjunctivae are normal.  No scleral icterus. Neck: Neck supple. Trachea midline. Cardiovascular: Normal rate, regular rhythm and intact distal pulses. No M/R/G Pulmonary/chest: Effort normal and breath sounds normal. No wheezing, rales or rhonchi. Abdominal:  Soft, nontender, nondistended. Bowel sounds active throughout. There are no masses palpable. No hepatosplenomegaly. Extremities: no clubbing, cyanosis, or edema Neurological: Alert and oriented to person place and time. Skin: Skin is warm and dry. Psychiatric: Normal mood and affect. Behavior is normal.  MRI ABDOMEN WITHOUT AND WITH CONTRAST (INCLUDING  MRCP) -- March 2014   Technique:  Multiplanar multisequence MR imaging of the abdomen was performed both before and after the administration of intravenous contrast. Heavily T2-weighted images of the biliary and pancreatic ducts were obtained, and three-dimensional MRCP images were rendered by post processing.   Contrast: 33m MULTIHANCE GADOBENATE DIMEGLUMINE 529 MG/ML IV SOLN   Comparison:  Abdominal [place]oreadated 09/24/2012.  CT abdomen pelvis dated 06/17/2004.   Findings:  Mild hepatic steatosis.  Liver is otherwise within normal limits.  No suspicious/enhancing hepatic lesions.   Spleen, pancreas, and adrenal glands are within normal limits.   Nodular appearance of the gallbladder fundus (series 3/image 27). Mild vague enhancement (series 1401/image 51).  This appearance is typical of gallbladder adenomyomatosis.  No associated findings worrisome for primary gallbladder adenocarcinoma.   No intrahepatic or extrahepatic ductal dilatation.  Common duct measures 6 mm, within normal limits.   1.6 x 1.4 cm right upper pole cyst (series 11/image 27).  3.9 x 2.8 cm left lower pole cyst (series 11/image 39).  No enhancing lesions.  No hydronephrosis.   No abdominal ascites.   Mild nonspecific jejunal mesenteric stranding (series 11/image 37), of doubtful clinical significance.  No suspicious abdominal lymphadenopathy.   No focal osseous lesions.   IMPRESSION: Suspected gallbladder adenomyomatosis, accounting for the sonographic abnormality.   No intrahepatic or extrahepatic ductal dilatation.  Common duct measures 6 mm, within normal limits.   Bilateral renal cysts, as described above.   Mild hepatic steatosis.     Original Report Authenticated By: SJulian Hy M.D.     Assessment & Plan:  66year old female with a history of GERD, irritable bowel, gallbladder adenomyomatosis who is seen in consultation at the request of Dr. ADagmar Haitto evaluate coughing and burning  throat pain.   1. GERD/chronic coughing/throat pain -- her symptoms could be attributable to GERD specifically LPR. She is still having symptoms despite twice a day PPI. Given these ongoing issues I recommended we proceed to upper endoscopy. We discussed the risks, benefits and alternatives and she is agreeable and wishes to proceed. If upper endoscopy is unremarkable we may need 24-hour pH and impedance testing to see exactly how much she is refluxing and if her acid is well controlled. I have recommended GERD diet and that she elevate the head of her bed. Continue to avoid eating and drinking within 2 hours of lying flat. After this evaluation if EGD unremarkable and acid documented to be well controlled we may have her revisit with her ENT doctor more  2. History of gallbladder adenomyomatosis -- she met with Dr. WDonne Hazelwho offered cholecystectomy but at that time the patient declined. She is not having biliary or right upper quadrant pain now. She does have some catching in her right side with bending but this is positional and likely musculoskeletal. I recommended that we repeat a limited right upper quadrant ultrasound to ensure that the gallbladder is stable given the history of adenomyomatosis.  3. Family history of colon polyps -- repeat screening colonoscopy recommended March 2019    CC [facility]

## 2017-02-10 ENCOUNTER — Ambulatory Visit (HOSPITAL_COMMUNITY)
Admission: RE | Admit: 2017-02-10 | Discharge: 2017-02-10 | Disposition: A | Discharge status: Home / Self Care | Payer: Medicare Other | Source: Ambulatory Visit | Attending: Internal Medicine | Admitting: Internal Medicine

## 2017-02-10 ENCOUNTER — Other Ambulatory Visit: Payer: Self-pay

## 2017-02-10 DIAGNOSIS — K824 Cholesterolosis of gallbladder: Secondary | ICD-10-CM

## 2017-02-10 DIAGNOSIS — D135 Benign neoplasm of extrahepatic bile ducts: Secondary | ICD-10-CM | POA: Insufficient documentation

## 2017-02-17 ENCOUNTER — Other Ambulatory Visit: Payer: Self-pay | Admitting: Internal Medicine

## 2017-02-17 DIAGNOSIS — K824 Cholesterolosis of gallbladder: Secondary | ICD-10-CM

## 2017-02-18 ENCOUNTER — Ambulatory Visit (HOSPITAL_COMMUNITY)
Admission: RE | Admit: 2017-02-18 | Discharge: 2017-02-18 | Disposition: A | Discharge status: Home / Self Care | Payer: Medicare Other | Source: Ambulatory Visit | Attending: Internal Medicine | Admitting: Internal Medicine

## 2017-02-18 DIAGNOSIS — K824 Cholesterolosis of gallbladder: Secondary | ICD-10-CM | POA: Insufficient documentation

## 2017-02-18 DIAGNOSIS — K828 Other specified diseases of gallbladder: Secondary | ICD-10-CM | POA: Diagnosis not present

## 2017-02-18 LAB — POCT I-STAT CREATININE: Creatinine, Ser: 1 mg/dL (ref 0.44–1.00)

## 2017-02-18 MED ORDER — GADOBENATE DIMEGLUMINE 529 MG/ML IV SOLN
15.0000 mL | Freq: Once | INTRAVENOUS | Status: AC | PRN
  Administered 2017-02-18: 17 mL via INTRAVENOUS

## 2017-02-24 ENCOUNTER — Ambulatory Visit (AMBULATORY_SURGERY_CENTER): Payer: Medicare Other | Admitting: Internal Medicine

## 2017-02-24 ENCOUNTER — Encounter: Payer: Self-pay | Admitting: Internal Medicine

## 2017-02-24 VITALS — BP 120/66 | HR 71 | Temp 98.9°F | Resp 15 | Ht 64.0 in | Wt 186.0 lb

## 2017-02-24 DIAGNOSIS — R05 Cough: Secondary | ICD-10-CM | POA: Diagnosis not present

## 2017-02-24 DIAGNOSIS — R070 Pain in throat: Secondary | ICD-10-CM

## 2017-02-24 DIAGNOSIS — K219 Gastro-esophageal reflux disease without esophagitis: Secondary | ICD-10-CM | POA: Diagnosis not present

## 2017-02-24 MED ORDER — SODIUM CHLORIDE 0.9 % IV SOLN: 500.0000 mL | INTRAVENOUS | Status: DC

## 2017-02-24 NOTE — Progress Notes (Signed)
Dental advisory given to [name] and oriented x3, pleased with MAC, report to RN Vinnie Level

## 2017-02-24 NOTE — Progress Notes (Signed)
Pt's states no medical or surgical changes since previsit or office visit. 

## 2017-02-24 NOTE — Progress Notes (Signed)
Called to room to assist during endoscopic procedure.  Patient ID and intended procedure confirmed with present staff. Received instructions for my participation in the procedure from the performing physician.  

## 2017-02-24 NOTE — Op Note (Signed)
[facility] Patient Name: Siriah Treat Procedure Date: 02/24/2017 3:08 PM MRN: [id number] Endoscopist: Jerene Bears , MD Age: 66 Referring MD:  Date of Birth: 22-May-1951 Gender: Female Account #: [id number] Procedure:                Upper GI endoscopy Indications:              Gastro-esophageal reflux disease, Chronic cough,                            Sore throat Medicines:                Monitored Anesthesia Care Procedure:                Pre-Anesthesia Assessment:                           - Prior to the procedure, a History and Physical                            was performed, and patient medications and                            allergies were reviewed. The patient's tolerance of                            previous anesthesia was also reviewed. The risks                            and benefits of the procedure and the sedation                            options and risks were discussed with the patient.                            All questions were answered, and informed consent                            was obtained. Prior Anticoagulants: The patient has                            taken no previous anticoagulant or antiplatelet                            agents. ASA Grade Assessment: II - A patient with                            mild systemic disease. After reviewing the risks                            and benefits, the patient was deemed in                            satisfactory condition to undergo the procedure.  After obtaining informed consent, the endoscope was                            passed under direct vision. Throughout the                            procedure, the patient's blood pressure, pulse, and                            oxygen saturations were monitored continuously. The                            Model GIF-[id number]) scope was introduced                            through the mouth, and advanced to the second  part                            of duodenum. The upper GI endoscopy was                            accomplished without difficulty. The patient                            tolerated the procedure well. Scope In: Scope Out: Findings:                 The Z-line was irregular and was found 39 cm from                            the incisors. Multiple biopsies were obtained with                            cold forceps for evaluation to rule out Barrett's                            Esophagus in a targeted manner in the lower third                            of the esophagus.                           The exam of the esophagus was otherwise normal.                           The cardia and gastric fundus were normal on                            retroflexion.                           The entire examined stomach was normal.                           The examined duodenum was normal. Complications:  No immediate complications. Estimated Blood Loss:     Estimated blood loss was minimal. Impression:               - Z-line irregular, 39 cm from the incisors.                            Multiple biopsies to exclude Barrett's esophagus.                           - Normal stomach.                           - Normal examined duodenum. Recommendation:           - Patient has a contact number available for                            emergencies. The signs and symptoms of potential                            delayed complications were discussed with the                            patient. Return to normal activities tomorrow.                            Written discharge instructions were provided to the                            patient.                           - Resume previous diet.                           - Continue present medications.                           - Await pathology results.                           - 24-hour pH impedance testing recommended for                             persistent LPR symptoms to determine if acid is                            adequately controlled on twice a day PPI Jerene Bears, MD 02/24/2017 3:26:47 PM This report has been signed electronically.

## 2017-02-24 NOTE — Patient Instructions (Signed)
YOU HAD AN ENDOSCOPIC PROCEDURE TODAY AT THE [facility]:   Refer to the procedure report that was given to you for any specific questions about what was found during the examination.  If the procedure report does not answer your questions, please call your gastroenterologist to clarify.  If you requested that your care partner not be given the details of your procedure findings, then the procedure report has been included in a sealed envelope for you to review at your convenience later.  YOU SHOULD EXPECT: Some feelings of bloating in the abdomen. Passage of more gas than usual.  Walking can help get rid of the air that was put into your GI tract during the procedure and reduce the bloating.   Please Note:  You might notice some irritation and congestion in your nose or some drainage.  This is from the oxygen used during your procedure.  There is no need for concern and it should clear up in a day or so.  SYMPTOMS TO REPORT IMMEDIATELY:   Following upper endoscopy (EGD)  Vomiting of blood or coffee ground material  New chest pain or pain under the shoulder blades  Painful or persistently difficult swallowing  New shortness of breath  Fever of 100F or higher  Black, tarry-looking stools  For urgent or emergent issues, a gastroenterologist can be reached at any hour by calling [phone].   DIET:  We do recommend a small meal at first, but then you may proceed to your regular diet.  Drink plenty of fluids but you should avoid alcoholic beverages for 24 hours.  ACTIVITY:  You should plan to take it easy for the rest of today and you should NOT DRIVE or use heavy machinery until tomorrow (because of the sedation medicines used during the test).    FOLLOW UP: Our staff will call the number listed on your records the next business day following your procedure to check on you and address any questions or concerns that you may have regarding the information given to you following  your procedure. If we do not reach you, we will leave a message.  However, if you are feeling well and you are not experiencing any problems, there is no need to return our call.  We will assume that you have returned to your regular daily activities without incident.  If any biopsies were taken you will be contacted by phone or by letter within the next 1-3 weeks.  Please call us at [phone] if you have not heard about the biopsies in 3 weeks.    SIGNATURES/CONFIDENTIALITY: You and/or your care partner have signed paperwork which will be entered into your electronic medical record.  These signatures attest to the fact that that the information above on your After Visit Summary has been reviewed and is understood.  Full responsibility of the confidentiality of this discharge information lies with you and/or your care-partner. 

## 2017-02-25 ENCOUNTER — Telehealth: Payer: Self-pay

## 2017-02-25 ENCOUNTER — Telehealth: Payer: Self-pay | Admitting: *Deleted

## 2017-02-25 ENCOUNTER — Other Ambulatory Visit: Payer: Self-pay

## 2017-02-25 NOTE — Telephone Encounter (Signed)
Second call today.  No answer, Left message to call if questions or concerns.

## 2017-02-25 NOTE — Telephone Encounter (Signed)
Pt scheduled for 24 hour ph impedance testing at [facility] 03/16/17@8 :30am. Left message for pt to call back.

## 2017-02-25 NOTE — Telephone Encounter (Signed)
Attempted to reach patient for post-procedure f/u call. No answer. Left message that we will attempt to reach her again later today and for her to please call us if she has any questions/concerns about her care.

## 2017-02-27 NOTE — Telephone Encounter (Signed)
Pt scheduled for 24 hour ph impedance test at [facility] 03/16/17@8 :30am. Pt aware and instructions mailed to pt.

## 2017-03-03 ENCOUNTER — Encounter: Payer: Self-pay | Admitting: Internal Medicine

## 2017-03-16 ENCOUNTER — Encounter (HOSPITAL_COMMUNITY)
Admission: RE | Disposition: A | Discharge status: Home / Self Care | Payer: Self-pay | Source: Ambulatory Visit | Attending: Internal Medicine

## 2017-03-16 ENCOUNTER — Ambulatory Visit (HOSPITAL_COMMUNITY)
Admission: RE | Admit: 2017-03-16 | Discharge: 2017-03-16 | Disposition: A | Discharge status: Home / Self Care | Payer: Medicare Other | Source: Ambulatory Visit | Attending: Internal Medicine | Admitting: Internal Medicine

## 2017-03-16 DIAGNOSIS — K449 Diaphragmatic hernia without obstruction or gangrene: Secondary | ICD-10-CM | POA: Insufficient documentation

## 2017-03-16 DIAGNOSIS — K219 Gastro-esophageal reflux disease without esophagitis: Secondary | ICD-10-CM | POA: Diagnosis not present

## 2017-03-16 DIAGNOSIS — R490 Dysphonia: Secondary | ICD-10-CM

## 2017-03-16 DIAGNOSIS — Z79899 Other long term (current) drug therapy: Secondary | ICD-10-CM | POA: Insufficient documentation

## 2017-03-16 HISTORY — PX: ESOPHAGEAL MANOMETRY: SHX5429

## 2017-03-16 HISTORY — PX: 24 HOUR PH STUDY: SHX5419

## 2017-03-16 SURGERY — MONITORING, ESOPHAGEAL PH, 24 HOUR

## 2017-03-16 MED ORDER — LIDOCAINE VISCOUS 2 % MT SOLN
OROMUCOSAL | Status: AC
Start: 2017-03-16 — End: 2017-03-16
  Filled 2017-03-16: qty 15

## 2017-03-16 SURGICAL SUPPLY — 2 items
FACESHIELD LNG OPTICON STERILE (SAFETY) IMPLANT
GLOVE BIO SURGEON STRL SZ8 (GLOVE) ×4 IMPLANT

## 2017-03-16 NOTE — Progress Notes (Signed)
Esophageal Manometry done per protocol.  Pt. Tolerated well. Without complication.  Ph probe placed per protocol.  Pt. Tolerated well, without complication. Pt. Educated on how to use digitrapper and verbalized understanding.  Pt. Doing procedure on meds.  Dr. Hilarie Fredrickson to be notified today.    Laverta Baltimore, RN

## 2017-03-18 ENCOUNTER — Encounter (HOSPITAL_COMMUNITY): Payer: Self-pay | Admitting: Internal Medicine

## 2017-03-30 ENCOUNTER — Telehealth: Payer: Self-pay | Admitting: Internal Medicine

## 2017-03-30 DIAGNOSIS — R490 Dysphonia: Secondary | ICD-10-CM

## 2017-03-30 DIAGNOSIS — K219 Gastro-esophageal reflux disease without esophagitis: Secondary | ICD-10-CM

## 2017-03-30 NOTE — Telephone Encounter (Signed)
Patient is calling for pH probe results from 03/17/17.  Have you seen them?

## 2017-03-30 NOTE — Telephone Encounter (Signed)
Esophageal manometry is normal The esophagus functions normally with no evidence of motility disorder The pH study results are as follows (formal reports for both mano and pH impedence will be scanned) Impression Good acid suppression on PPI Poor symptom correlation No evidence of uncontrolled acid reflux. Laryngeal symptoms (LPR) are not felt related to acid reflux based on these findings.  Acid is controlled adequately.  ENT would be the next step if not already performed if hoarseness continues to trouble her Office followup with me can be arranged if not already, next available

## 2017-03-31 NOTE — Telephone Encounter (Signed)
Patient notified of the results and recommendations She will come for follow up on 06/04/17 9:00

## 2017-04-29 DIAGNOSIS — J3089 Other allergic rhinitis: Secondary | ICD-10-CM | POA: Diagnosis not present

## 2017-05-21 DIAGNOSIS — I10 Essential (primary) hypertension: Secondary | ICD-10-CM | POA: Diagnosis not present

## 2017-05-21 DIAGNOSIS — E784 Other hyperlipidemia: Secondary | ICD-10-CM | POA: Diagnosis not present

## 2017-05-28 DIAGNOSIS — Z0000 Encounter for general adult medical examination without abnormal findings: Secondary | ICD-10-CM | POA: Diagnosis not present

## 2017-05-28 DIAGNOSIS — M4800 Spinal stenosis, site unspecified: Secondary | ICD-10-CM | POA: Diagnosis not present

## 2017-05-28 DIAGNOSIS — I10 Essential (primary) hypertension: Secondary | ICD-10-CM | POA: Diagnosis not present

## 2017-05-28 DIAGNOSIS — Z6832 Body mass index (BMI) 32.0-32.9, adult: Secondary | ICD-10-CM | POA: Diagnosis not present

## 2017-05-28 DIAGNOSIS — R5383 Other fatigue: Secondary | ICD-10-CM | POA: Diagnosis not present

## 2017-05-28 DIAGNOSIS — K219 Gastro-esophageal reflux disease without esophagitis: Secondary | ICD-10-CM | POA: Diagnosis not present

## 2017-05-28 DIAGNOSIS — G43909 Migraine, unspecified, not intractable, without status migrainosus: Secondary | ICD-10-CM | POA: Diagnosis not present

## 2017-05-28 DIAGNOSIS — E784 Other hyperlipidemia: Secondary | ICD-10-CM | POA: Diagnosis not present

## 2017-05-28 DIAGNOSIS — K588 Other irritable bowel syndrome: Secondary | ICD-10-CM | POA: Diagnosis not present

## 2017-05-28 DIAGNOSIS — Z23 Encounter for immunization: Secondary | ICD-10-CM | POA: Diagnosis not present

## 2017-05-28 DIAGNOSIS — Z1389 Encounter for screening for other disorder: Secondary | ICD-10-CM | POA: Diagnosis not present

## 2017-05-28 DIAGNOSIS — F39 Unspecified mood [affective] disorder: Secondary | ICD-10-CM | POA: Diagnosis not present

## 2017-05-29 DIAGNOSIS — Z1212 Encounter for screening for malignant neoplasm of rectum: Secondary | ICD-10-CM | POA: Diagnosis not present

## 2017-06-04 ENCOUNTER — Ambulatory Visit (INDEPENDENT_AMBULATORY_CARE_PROVIDER_SITE_OTHER): Payer: Medicare Other | Admitting: Internal Medicine

## 2017-06-04 ENCOUNTER — Encounter: Payer: Self-pay | Admitting: Internal Medicine

## 2017-06-04 ENCOUNTER — Encounter (INDEPENDENT_AMBULATORY_CARE_PROVIDER_SITE_OTHER): Payer: Self-pay

## 2017-06-04 VITALS — BP 110/70 | HR 72 | Ht 64.0 in | Wt 184.5 lb

## 2017-06-04 DIAGNOSIS — Z8371 Family history of colonic polyps: Secondary | ICD-10-CM

## 2017-06-04 DIAGNOSIS — K219 Gastro-esophageal reflux disease without esophagitis: Secondary | ICD-10-CM

## 2017-06-04 DIAGNOSIS — R05 Cough: Secondary | ICD-10-CM | POA: Diagnosis not present

## 2017-06-04 DIAGNOSIS — R053 Chronic cough: Secondary | ICD-10-CM

## 2017-06-04 DIAGNOSIS — D135 Benign neoplasm of extrahepatic bile ducts: Secondary | ICD-10-CM

## 2017-06-04 MED ORDER — PANTOPRAZOLE SODIUM 40 MG PO TBEC: 40.0000 mg | DELAYED_RELEASE_TABLET | Freq: Every day | ORAL | 3 refills | Status: DC

## 2017-06-04 NOTE — Progress Notes (Signed)
Subjective:    Patient ID: [name], female    DOB: 10/04/1950, 66 y.o.   MRN: [id number]  HPI [name] is a 66 year old female with a history of GERD, chronic cough, irritable bowel, gallbladder adenomyomatosis, and family history of colon polyps is here for follow-up. She is here alone today.  We have waited her LPR type symptoms and cough with upper endoscopy as well as esophageal manometry with 24-hour pH and impedance testing. The pH study was performed in July 2018. Manometry was normal with no significant peristaltic abnormality. A 24-hour pH and impedance showed excellent reflux control with normal esophageal acid exposure in both upright and supine position. There was no symptom correlation for cough. There were overall very few reflux episodes predominantly weak acid which were within normal limits. This study was performed on twice a day pantoprazole.  She reports that her cough symptoms and throat clearing have continued. Her primary care is going to send her back to allergy and immunology for evaluation of an allergic type response and asthma. She's had no heartburn. No dysphagia or odynophagia. Occasionally she will feel chest congestion and chest tightness and occasionally coughing with deep breathing. Fluids also seem to make her cough worse.  She has had intermittent "catching" type right upper quadrant pain. This is worse with bending over while gardening and also even while painting her toenails. No relation to eating or bowel movement. Bowel movements have been mostly regular for her without blood in her stool or melena.  Her gallbladder was evaluated for adenomyomatosis. A few years ago Dr. Donne Hazel and she discussed cholecystectomy but she preferred to avoid this. MRI performed earlier this year showed stable adenomyomatosis with no alarming features.   Review of Systems As per HPI, otherwise negative  Current Medications, Allergies, Past Medical History, Past  Surgical History, Family History and Social History were reviewed in [employer] record.      Objective:   Physical Exam BP 110/70   Pulse 72   Ht 5\' 4"  (1.626 m)   Wt 184 lb 8 oz (83.7 kg)   BMI 31.67 kg/m  Constitutional: Well-developed and well-nourished. No distress. HEENT: Normocephalic and atraumatic.Conjunctivae are normal.  No scleral icterus. Neck: Neck supple. Trachea midline. Cardiovascular: Normal rate, regular rhythm and intact distal pulses. No M/R/G Pulmonary/chest: Effort normal and breath sounds normal. No wheezing, rales or rhonchi. Abdominal: Soft, nontender, nondistended. Bowel sounds active throughout.  Extremities: no clubbing, cyanosis, or edema Neurological: Alert and oriented to person place and time. Skin: Skin is warm and dry. Psychiatric: Normal mood and affect. Behavior is normal.     Assessment & Plan:  66 year old female with a history of GERD, chronic cough, irritable bowel, gallbladder adenomyomatosis, and family history of colon polyps is here for follow-up  1. GERD -- well-controlled on twice a day pantoprazole. I expect she can reduce to once daily dosing. We will have her decrease pantoprazole 40 mg once daily. Recent EGD with no evidence of Barrett's esophagus.  2. Chronic cough -- 24-hour pH and impedance prove that this symptom is not reflux related. See #1. She's being referred back to allergy and immunology. If no etiology found for cough and no beneficial treatment then would consider pulmonology evaluation.  3. Gallbladder adenomyomatosis -- stable over multiple imaging studies. No further imaging will be performed. I do not think her right upper quadrant pain is related to her gallbladder but more likely musculoskeletal in nature. This pain is also minor  for her and stable without progression  4. Family history of colon polyps -- she is due screening colonoscopy in March 2019  Annual follow-up for GERD, March for  screening colonoscopy 25 minutes spent with the patient today. Greater than 50% was spent in counseling and coordination of care with the patient

## 2017-06-04 NOTE — Patient Instructions (Addendum)
Decrease your pantoprazole to once daily dosing.  Follow up with Dr Hilarie Fredrickson in 1-2 years.   You will be due for a recall colonoscopy in 11/2017. We will send you a reminder in the mail when it gets closer to that time.  If you are age 66 or older, your body mass index should be between 23-30. Your Body mass index is 31.67 kg/m. If this is out of the aforementioned range listed, please consider follow up with your Primary Care Provider.  If you are age 2 or younger, your body mass index should be between 19-25. Your Body mass index is 31.67 kg/m. If this is out of the aformentioned range listed, please consider follow up with your Primary Care Provider.

## 2017-06-24 DIAGNOSIS — Z6831 Body mass index (BMI) 31.0-31.9, adult: Secondary | ICD-10-CM | POA: Diagnosis not present

## 2017-06-24 DIAGNOSIS — Z1231 Encounter for screening mammogram for malignant neoplasm of breast: Secondary | ICD-10-CM | POA: Diagnosis not present

## 2017-06-24 DIAGNOSIS — Z124 Encounter for screening for malignant neoplasm of cervix: Secondary | ICD-10-CM | POA: Diagnosis not present

## 2017-06-26 DIAGNOSIS — H6123 Impacted cerumen, bilateral: Secondary | ICD-10-CM | POA: Diagnosis not present

## 2017-06-26 DIAGNOSIS — J3089 Other allergic rhinitis: Secondary | ICD-10-CM | POA: Diagnosis not present

## 2017-06-26 DIAGNOSIS — R05 Cough: Secondary | ICD-10-CM | POA: Diagnosis not present

## 2017-07-02 ENCOUNTER — Other Ambulatory Visit: Payer: Self-pay | Admitting: Internal Medicine

## 2017-07-09 DIAGNOSIS — R05 Cough: Secondary | ICD-10-CM | POA: Diagnosis not present

## 2017-07-13 DIAGNOSIS — J3089 Other allergic rhinitis: Secondary | ICD-10-CM | POA: Diagnosis not present

## 2017-07-16 DIAGNOSIS — J3089 Other allergic rhinitis: Secondary | ICD-10-CM | POA: Diagnosis not present
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
## 2017-09-16 DIAGNOSIS — I10 Essential (primary) hypertension: Secondary | ICD-10-CM | POA: Diagnosis not present

## 2017-09-16 DIAGNOSIS — Z6832 Body mass index (BMI) 32.0-32.9, adult: Secondary | ICD-10-CM | POA: Diagnosis not present

## 2017-09-16 DIAGNOSIS — F39 Unspecified mood [affective] disorder: Secondary | ICD-10-CM | POA: Diagnosis not present

## 2017-09-16 DIAGNOSIS — Z23 Encounter for immunization: Secondary | ICD-10-CM | POA: Diagnosis not present

## 2017-10-20 DIAGNOSIS — H40023 Open angle with borderline findings, high risk, bilateral: Secondary | ICD-10-CM | POA: Diagnosis not present

## 2017-10-20 DIAGNOSIS — H18893 Other specified disorders of cornea, bilateral: Secondary | ICD-10-CM | POA: Diagnosis not present

## 2017-10-20 DIAGNOSIS — H2513 Age-related nuclear cataract, bilateral: Secondary | ICD-10-CM | POA: Diagnosis not present

## 2017-10-20 DIAGNOSIS — Z83511 Family history of glaucoma: Secondary | ICD-10-CM | POA: Diagnosis not present

## 2017-10-20 DIAGNOSIS — H40053 Ocular hypertension, bilateral: Secondary | ICD-10-CM | POA: Diagnosis not present

## 2017-11-30 ENCOUNTER — Encounter: Payer: Self-pay | Admitting: Internal Medicine

## 2018-01-21 DIAGNOSIS — J3089 Other allergic rhinitis: Secondary | ICD-10-CM | POA: Diagnosis not present

## 2018-03-18 DIAGNOSIS — J3089 Other allergic rhinitis: Secondary | ICD-10-CM | POA: Diagnosis not present

## 2018-04-20 DIAGNOSIS — H40013 Open angle with borderline findings, low risk, bilateral: Secondary | ICD-10-CM | POA: Diagnosis not present

## 2018-06-17 DIAGNOSIS — E7849 Other hyperlipidemia: Secondary | ICD-10-CM | POA: Diagnosis not present

## 2018-06-17 DIAGNOSIS — I10 Essential (primary) hypertension: Secondary | ICD-10-CM | POA: Diagnosis not present

## 2018-06-17 DIAGNOSIS — R82998 Other abnormal findings in urine: Secondary | ICD-10-CM | POA: Diagnosis not present

## 2018-06-23 DIAGNOSIS — R05 Cough: Secondary | ICD-10-CM | POA: Diagnosis not present

## 2018-06-23 DIAGNOSIS — L988 Other specified disorders of the skin and subcutaneous tissue: Secondary | ICD-10-CM | POA: Diagnosis not present

## 2018-06-23 DIAGNOSIS — F39 Unspecified mood [affective] disorder: Secondary | ICD-10-CM | POA: Diagnosis not present

## 2018-06-23 DIAGNOSIS — K589 Irritable bowel syndrome without diarrhea: Secondary | ICD-10-CM | POA: Diagnosis not present

## 2018-06-23 DIAGNOSIS — Z6830 Body mass index (BMI) 30.0-30.9, adult: Secondary | ICD-10-CM | POA: Diagnosis not present

## 2018-06-23 DIAGNOSIS — Z1389 Encounter for screening for other disorder: Secondary | ICD-10-CM | POA: Diagnosis not present

## 2018-06-23 DIAGNOSIS — G43909 Migraine, unspecified, not intractable, without status migrainosus: Secondary | ICD-10-CM | POA: Diagnosis not present

## 2018-06-23 DIAGNOSIS — I10 Essential (primary) hypertension: Secondary | ICD-10-CM | POA: Diagnosis not present

## 2018-06-23 DIAGNOSIS — N183 Chronic kidney disease, stage 3 (moderate): Secondary | ICD-10-CM | POA: Diagnosis not present

## 2018-06-23 DIAGNOSIS — K219 Gastro-esophageal reflux disease without esophagitis: Secondary | ICD-10-CM | POA: Diagnosis not present

## 2018-06-23 DIAGNOSIS — E7849 Other hyperlipidemia: Secondary | ICD-10-CM | POA: Diagnosis not present

## 2018-06-23 DIAGNOSIS — Z0000 Encounter for general adult medical examination without abnormal findings: Secondary | ICD-10-CM | POA: Diagnosis not present

## 2018-06-24 DIAGNOSIS — Z1212 Encounter for screening for malignant neoplasm of rectum: Secondary | ICD-10-CM | POA: Diagnosis not present

## 2018-07-01 ENCOUNTER — Encounter: Payer: Self-pay | Admitting: Internal Medicine

## 2018-07-01 DIAGNOSIS — J3089 Other allergic rhinitis: Secondary | ICD-10-CM | POA: Diagnosis not present

## 2018-08-20 ENCOUNTER — Encounter: Payer: Medicare Other | Admitting: Internal Medicine

## 2018-10-26 DIAGNOSIS — H40013 Open angle with borderline findings, low risk, bilateral: Secondary | ICD-10-CM | POA: Diagnosis not present

## 2018-11-03 DIAGNOSIS — J3089 Other allergic rhinitis: Secondary | ICD-10-CM | POA: Diagnosis not present

## 2018-11-11 DIAGNOSIS — J3089 Other allergic rhinitis: Secondary | ICD-10-CM | POA: Diagnosis not present

## 2018-12-21 DIAGNOSIS — F39 Unspecified mood [affective] disorder: Secondary | ICD-10-CM | POA: Diagnosis not present

## 2018-12-21 DIAGNOSIS — I129 Hypertensive chronic kidney disease with stage 1 through stage 4 chronic kidney disease, or unspecified chronic kidney disease: Secondary | ICD-10-CM | POA: Diagnosis not present

## 2018-12-21 DIAGNOSIS — N183 Chronic kidney disease, stage 3 (moderate): Secondary | ICD-10-CM | POA: Diagnosis not present

## 2018-12-21 DIAGNOSIS — Z1331 Encounter for screening for depression: Secondary | ICD-10-CM | POA: Diagnosis not present

## 2018-12-21 DIAGNOSIS — R5383 Other fatigue: Secondary | ICD-10-CM | POA: Diagnosis not present

## 2018-12-21 DIAGNOSIS — E785 Hyperlipidemia, unspecified: Secondary | ICD-10-CM | POA: Diagnosis not present

## 2018-12-21 DIAGNOSIS — R05 Cough: Secondary | ICD-10-CM | POA: Diagnosis not present

## 2018-12-22 DIAGNOSIS — E7849 Other hyperlipidemia: Secondary | ICD-10-CM | POA: Diagnosis not present

## 2018-12-22 DIAGNOSIS — I10 Essential (primary) hypertension: Secondary | ICD-10-CM | POA: Diagnosis not present

## 2019-02-17 DIAGNOSIS — J301 Allergic rhinitis due to pollen: Secondary | ICD-10-CM | POA: Diagnosis not present

## 2019-02-21 DIAGNOSIS — J301 Allergic rhinitis due to pollen: Secondary | ICD-10-CM | POA: Diagnosis not present

## 2019-04-21 DIAGNOSIS — H40013 Open angle with borderline findings, low risk, bilateral: Secondary | ICD-10-CM | POA: Diagnosis not present

## 2019-06-30 DIAGNOSIS — J309 Allergic rhinitis, unspecified: Secondary | ICD-10-CM | POA: Diagnosis not present

## 2019-07-07 DIAGNOSIS — R110 Nausea: Secondary | ICD-10-CM | POA: Diagnosis not present

## 2019-07-07 DIAGNOSIS — R438 Other disturbances of smell and taste: Secondary | ICD-10-CM | POA: Diagnosis not present

## 2019-07-07 DIAGNOSIS — K589 Irritable bowel syndrome without diarrhea: Secondary | ICD-10-CM | POA: Diagnosis not present

## 2019-07-07 DIAGNOSIS — R197 Diarrhea, unspecified: Secondary | ICD-10-CM | POA: Diagnosis not present

## 2019-07-07 DIAGNOSIS — Z20818 Contact with and (suspected) exposure to other bacterial communicable diseases: Secondary | ICD-10-CM | POA: Diagnosis not present

## 2019-07-28 DIAGNOSIS — J301 Allergic rhinitis due to pollen: Secondary | ICD-10-CM | POA: Diagnosis not present

## 2019-09-27 DIAGNOSIS — H40013 Open angle with borderline findings, low risk, bilateral: Secondary | ICD-10-CM | POA: Diagnosis not present

## 2019-10-11 DIAGNOSIS — I129 Hypertensive chronic kidney disease with stage 1 through stage 4 chronic kidney disease, or unspecified chronic kidney disease: Secondary | ICD-10-CM | POA: Diagnosis not present

## 2019-10-11 DIAGNOSIS — Z20822 Contact with and (suspected) exposure to covid-19: Secondary | ICD-10-CM | POA: Diagnosis not present

## 2019-10-11 DIAGNOSIS — R110 Nausea: Secondary | ICD-10-CM | POA: Diagnosis not present

## 2019-10-11 DIAGNOSIS — R1030 Lower abdominal pain, unspecified: Secondary | ICD-10-CM | POA: Diagnosis not present

## 2019-10-11 DIAGNOSIS — K529 Noninfective gastroenteritis and colitis, unspecified: Secondary | ICD-10-CM | POA: Diagnosis not present

## 2019-10-11 DIAGNOSIS — N1831 Chronic kidney disease, stage 3a: Secondary | ICD-10-CM | POA: Diagnosis not present

## 2019-10-19 DIAGNOSIS — E7849 Other hyperlipidemia: Secondary | ICD-10-CM | POA: Diagnosis not present

## 2019-10-20 DIAGNOSIS — I10 Essential (primary) hypertension: Secondary | ICD-10-CM | POA: Diagnosis not present

## 2019-10-20 DIAGNOSIS — R82998 Other abnormal findings in urine: Secondary | ICD-10-CM | POA: Diagnosis not present

## 2019-10-25 DIAGNOSIS — N1831 Chronic kidney disease, stage 3a: Secondary | ICD-10-CM | POA: Diagnosis not present

## 2019-10-25 DIAGNOSIS — I129 Hypertensive chronic kidney disease with stage 1 through stage 4 chronic kidney disease, or unspecified chronic kidney disease: Secondary | ICD-10-CM | POA: Diagnosis not present

## 2019-10-25 DIAGNOSIS — Z0000 Encounter for general adult medical examination without abnormal findings: Secondary | ICD-10-CM | POA: Diagnosis not present

## 2019-10-25 DIAGNOSIS — M4800 Spinal stenosis, site unspecified: Secondary | ICD-10-CM | POA: Diagnosis not present

## 2019-10-25 DIAGNOSIS — K219 Gastro-esophageal reflux disease without esophagitis: Secondary | ICD-10-CM | POA: Diagnosis not present

## 2019-10-25 DIAGNOSIS — F39 Unspecified mood [affective] disorder: Secondary | ICD-10-CM | POA: Diagnosis not present

## 2019-10-25 DIAGNOSIS — Z1331 Encounter for screening for depression: Secondary | ICD-10-CM | POA: Diagnosis not present

## 2019-10-25 DIAGNOSIS — E785 Hyperlipidemia, unspecified: Secondary | ICD-10-CM | POA: Diagnosis not present

## 2019-10-25 DIAGNOSIS — J309 Allergic rhinitis, unspecified: Secondary | ICD-10-CM | POA: Diagnosis not present

## 2019-10-25 DIAGNOSIS — R05 Cough: Secondary | ICD-10-CM | POA: Diagnosis not present

## 2019-10-25 DIAGNOSIS — K589 Irritable bowel syndrome without diarrhea: Secondary | ICD-10-CM | POA: Diagnosis not present

## 2019-10-25 DIAGNOSIS — G43909 Migraine, unspecified, not intractable, without status migrainosus: Secondary | ICD-10-CM | POA: Diagnosis not present

## 2019-10-25 DIAGNOSIS — K529 Noninfective gastroenteritis and colitis, unspecified: Secondary | ICD-10-CM | POA: Diagnosis not present

## 2019-10-27 ENCOUNTER — Ambulatory Visit (INDEPENDENT_AMBULATORY_CARE_PROVIDER_SITE_OTHER): Payer: Medicare Other | Admitting: Otolaryngology

## 2019-11-10 ENCOUNTER — Ambulatory Visit (INDEPENDENT_AMBULATORY_CARE_PROVIDER_SITE_OTHER): Payer: Medicare Other | Admitting: Otolaryngology

## 2019-11-10 ENCOUNTER — Other Ambulatory Visit: Payer: Self-pay

## 2019-11-10 VITALS — Temp 97.7°F

## 2019-11-10 DIAGNOSIS — H6123 Impacted cerumen, bilateral: Secondary | ICD-10-CM | POA: Diagnosis not present

## 2019-11-10 NOTE — Progress Notes (Signed)
HPI: [name] is a 69 y.o. female who presents for evaluation of bilateral cerumen impactions.  She was seen by an outside audiologist who told her that her ears were full of cerumen.  She has previously seen Dr. Laurance Flatten for allergy shots and he routinely cleans her ears..  Past Medical History:  Diagnosis Date  . Allergic rhinitis   . Anxiety   . Depression   . GERD (gastroesophageal reflux disease)   . H/O colonoscopy    5 14 09 Dr Oletta Lamas  . Hiatal hernia   . Hypertension   . IBS (irritable bowel syndrome)   . Internal hemorrhoids   . Irregular heart beats   . Migraine   . Spinal stenosis    Past Surgical History:  Procedure Laterality Date  . [place] STUDY N/A 03/16/2017   Procedure: [place] STUDY;  Surgeon: Jerene Bears, MD;  Location: [facility];  Service: Gastroenterology;  Laterality: N/A;  . COLONOSCOPY    . ESOPHAGEAL MANOMETRY N/A 03/16/2017   Procedure: ESOPHAGEAL MANOMETRY (EM);  Surgeon: Jerene Bears, MD;  Location: [facility];  Service: Gastroenterology;  Laterality: N/A;  . TONSILLECTOMY    . VAGINAL HYSTERECTOMY    . WISDOM TOOTH EXTRACTION     Social History   Socioeconomic History  . Marital status: Married    Spouse name: Not on file  . Number of children: Not on file  . Years of education: Not on file  . Highest education level: Not on file  Occupational History  . Not on file  Tobacco Use  . Smoking status: Never Smoker  . Smokeless tobacco: Never Used  Substance and Sexual Activity  . Alcohol use: Yes    Comment: very rare  . Drug use: No  . Sexual activity: Not on file  Other Topics Concern  . Not on file  Social History Narrative  . Not on file   Social Determinants of Health   Financial Resource Strain:   . Difficulty of Paying Living Expenses: Not on file  Food Insecurity:   . Worried About Charity fundraiser in the Last Year: Not on file  . Ran Out of Food in the Last Year: Not on file  Transportation Needs:   .  Lack of Transportation (Medical): Not on file  . Lack of Transportation (Non-Medical): Not on file  Physical Activity:   . Days of Exercise per Week: Not on file  . Minutes of Exercise per Session: Not on file  Stress:   . Feeling of Stress : Not on file  Social Connections:   . Frequency of Communication with Friends and Family: Not on file  . Frequency of Social Gatherings with Friends and Family: Not on file  . Attends Religious Services: Not on file  . Active Member of Clubs or Organizations: Not on file  . Attends Archivist Meetings: Not on file  . Marital Status: Not on file   Family History  Problem Relation Age of Onset  . Uterine cancer Mother   . Colon polyps Mother   . Heart disease Mother   . Irritable bowel syndrome Mother   . Cancer Mother        uterine  . Hypertension Mother   . COPD Mother   . Emphysema Father   . Hypertension Father   . Alcohol abuse Father   . COPD Brother   . Diabetes Brother   . Sleep apnea Brother   .  Irritable bowel syndrome Maternal Aunt   . Irritable bowel syndrome Cousin        x 4  . Cancer Cousin        breast  . Cancer Paternal Grandmother        breast  . Colon cancer Neg Hx   . Esophageal cancer Neg Hx   . Rectal cancer Neg Hx   . Stomach cancer Neg Hx    No Known Allergies Prior to Admission medications   Medication Sig Start Date End Date Taking? Authorizing Provider  AMBULATORY NON FORMULARY MEDICATION once a week. Allergy shot   Yes [provider]  buPROPion (WELLBUTRIN) 75 MG tablet Take 75 mg by mouth daily.   Yes [provider]  citalopram (CELEXA) 10 MG tablet Take 10 mg by mouth daily.   Yes [provider]  ibuprofen (ADVIL,MOTRIN) 600 MG tablet Take 1 tablet (600 mg total) by mouth every 6 (six) hours as needed for mild pain or moderate pain. 08/09/15  Yes West, Emily, PA-C  irbesartan-hydrochlorothiazide (AVALIDE) 150-12.5 MG tablet Take 1 tablet by mouth daily. for  blood pressure 01/01/17  Yes [provider]  NON FORMULARY Allergy shots   Yes [provider]  pantoprazole (PROTONIX) 40 MG tablet Take 1 tablet (40 mg total) by mouth daily. 06/04/17  Yes Pyrtle, Lajuan Lines, MD  rosuvastatin (CRESTOR) 5 MG tablet Take 5 mg by mouth daily. 08/28/16  Yes [provider]  SUMAtriptan (IMITREX) 100 MG tablet Take 100 mg by mouth daily as needed. 08/13/16  Yes [provider]     Positive ROS: Otherwise negative  All other systems have been reviewed and were otherwise negative with the exception of those mentioned in the HPI and as above.  Physical Exam: Constitutional: Alert, well-appearing, no acute distress Ears: External ears without lesions or tenderness. Ear canals are on the smaller side and she has completely occluded ear canals bilaterally with cerumen.  This was cleaned with suction and curette.  TMs were clear bilaterally. Nasal: External nose without lesions. Clear nasal passages Oral: Oropharynx clear. Neck: No palpable adenopathy or masses Respiratory: Breathing comfortably  Skin: No facial/neck lesions or rash noted.  Cerumen impaction removal  Date/Time: 11/10/2019 1:47 PM Performed by: Rozetta Nunnery, MD Authorized by: Rozetta Nunnery, MD   Consent:    Consent obtained:  Verbal   Consent given by:  Patient   Risks discussed:  Pain and bleeding Procedure details:    Location:  L ear and R ear   Procedure type: curette and suction   Post-procedure details:    Inspection:  TM intact and canal normal   Hearing quality:  Improved   Patient tolerance of procedure:  Tolerated well, no immediate complications Comments:     TMs were clear bilaterally    Assessment: Cerumen impactions  Plan: She will follow-up as needed  Radene Journey, MD

## 2019-11-17 ENCOUNTER — Other Ambulatory Visit: Payer: Self-pay | Admitting: Internal Medicine

## 2019-11-17 DIAGNOSIS — K529 Noninfective gastroenteritis and colitis, unspecified: Secondary | ICD-10-CM

## 2019-11-22 ENCOUNTER — Ambulatory Visit
Admission: RE | Admit: 2019-11-22 | Discharge: 2019-11-22 | Disposition: A | Discharge status: Home / Self Care | Payer: Medicare Other | Source: Ambulatory Visit | Attending: Internal Medicine | Admitting: Internal Medicine

## 2019-11-22 DIAGNOSIS — K529 Noninfective gastroenteritis and colitis, unspecified: Secondary | ICD-10-CM

## 2019-11-22 MED ORDER — IOPAMIDOL (ISOVUE-300) INJECTION 61%
100.0000 mL | Freq: Once | INTRAVENOUS | Status: AC | PRN
  Administered 2019-11-22: 100 mL via INTRAVENOUS

## 2019-11-24 DIAGNOSIS — R109 Unspecified abdominal pain: Secondary | ICD-10-CM | POA: Diagnosis not present

## 2019-11-24 DIAGNOSIS — J301 Allergic rhinitis due to pollen: Secondary | ICD-10-CM | POA: Diagnosis not present

## 2019-11-24 DIAGNOSIS — F329 Major depressive disorder, single episode, unspecified: Secondary | ICD-10-CM | POA: Diagnosis not present

## 2019-11-25 ENCOUNTER — Encounter (HOSPITAL_COMMUNITY): Payer: Self-pay

## 2019-11-25 ENCOUNTER — Other Ambulatory Visit: Payer: Self-pay

## 2019-11-25 ENCOUNTER — Ambulatory Visit (HOSPITAL_COMMUNITY)
Admission: EM | Admit: 2019-11-25 | Discharge: 2019-11-25 | Disposition: A | Discharge status: Home / Self Care | Payer: Medicare Other | Attending: Family Medicine | Admitting: Family Medicine

## 2019-11-25 ENCOUNTER — Ambulatory Visit (INDEPENDENT_AMBULATORY_CARE_PROVIDER_SITE_OTHER): Payer: Medicare Other

## 2019-11-25 DIAGNOSIS — M79641 Pain in right hand: Secondary | ICD-10-CM

## 2019-11-25 DIAGNOSIS — S60111A Contusion of right thumb with damage to nail, initial encounter: Secondary | ICD-10-CM | POA: Diagnosis not present

## 2019-11-25 DIAGNOSIS — S6991XA Unspecified injury of right wrist, hand and finger(s), initial encounter: Secondary | ICD-10-CM | POA: Diagnosis not present

## 2019-11-25 MED ORDER — HYDROCODONE-ACETAMINOPHEN 5-325 MG PO TABS: 1.0000 | ORAL_TABLET | Freq: Four times a day (QID) | ORAL | 0 refills | Status: DC | PRN

## 2019-11-25 MED ORDER — LIDOCAINE HCL 2 % IJ SOLN
INTRAMUSCULAR | Status: AC
  Filled 2019-11-25: qty 20

## 2019-11-25 NOTE — ED Triage Notes (Signed)
Pt c/o right hand/thumb injury. States she slammed her right hand in the driver's side car door this afternoon. C/o feeling 'faint', and nauseated since arriving. Pt breathing rapidly and shallowly-encouraged her to slow breathing. Right thumbnail with ecchymosis and swelling to thumb; limited ROM.

## 2019-11-25 NOTE — ED Provider Notes (Signed)
[place]    CSN: [id number] Arrival date & time: 11/25/19  [place]      History   Chief Complaint Chief Complaint  Patient presents with  . Finger Injury    HPI [name] is a 69 y.o. female.   Initial [place] urgent care visit.  Pt c/o right hand/thumb injury. States she slammed her right hand in the driver's side car door this afternoon. C/o feeling 'faint', and nauseated since arriving. Pt breathing rapidly and shallowly-encouraged her to slow breathing. Right thumbnail with ecchymosis and swelling to thumb; limited ROM.     Past Medical History:  Diagnosis Date  . Allergic rhinitis   . Anxiety   . Depression   . GERD (gastroesophageal reflux disease)   . H/O colonoscopy    5 14 09 Dr Oletta Lamas  . Hiatal hernia   . Hypertension   . IBS (irritable bowel syndrome)   . Internal hemorrhoids   . Irregular heart beats   . Migraine   . Spinal stenosis     Patient Active Problem List   Diagnosis Date Noted  . Laryngopharyngeal reflux (LPR)   . Chronic hoarseness   . Essential hypertension, benign 11/04/2012  . GERD (gastroesophageal reflux disease) 11/04/2012  . IBS (irritable bowel syndrome) 11/04/2012    Past Surgical History:  Procedure Laterality Date  . [place] STUDY N/A 03/16/2017   Procedure: [place] STUDY;  Surgeon: Jerene Bears, MD;  Location: [facility];  Service: Gastroenterology;  Laterality: N/A;  . COLONOSCOPY    . ESOPHAGEAL MANOMETRY N/A 03/16/2017   Procedure: ESOPHAGEAL MANOMETRY (EM);  Surgeon: Jerene Bears, MD;  Location: [facility];  Service: Gastroenterology;  Laterality: N/A;  . TONSILLECTOMY    . VAGINAL HYSTERECTOMY    . WISDOM TOOTH EXTRACTION      OB History   No obstetric history on file.      Home Medications    Prior to Admission medications   Medication Sig Start Date End Date Taking? Authorizing Provider  citalopram (CELEXA) 10 MG tablet Take 10 mg by mouth daily.   Yes [provider]  gabapentin (NEURONTIN) 300 MG capsule TAKE 1 CAPSULE (300 MG TOTAL) BY MOUTH 3 TIMES DAILY 06/10/18  Yes [provider]  irbesartan-hydrochlorothiazide (AVALIDE) 150-12.5 MG tablet Take 1 tablet by mouth daily. for blood pressure 01/01/17  Yes [provider]  montelukast (SINGULAIR) 10 MG tablet Take 10 mg by mouth at bedtime. 10/16/19  Yes [provider]  pantoprazole (PROTONIX) 40 MG tablet Take 1 tablet (40 mg total) by mouth daily. 06/04/17  Yes Pyrtle, Lajuan Lines, MD  rosuvastatin (CRESTOR) 5 MG tablet Take 5 mg by mouth daily. 08/28/16  Yes [provider]  AMBULATORY NON FORMULARY MEDICATION once a week. Allergy shot    [provider]  atorvastatin (LIPITOR) 20 MG tablet Take 20 mg by mouth daily. 09/14/19   [provider]  HYDROcodone-acetaminophen (NORCO) 5-325 MG tablet Take 1 tablet by mouth every 6 (six) hours as needed for moderate pain. 11/25/19   Robyn Haber, MD  ibuprofen (ADVIL,MOTRIN) 600 MG tablet Take 1 tablet (600 mg total) by mouth every 6 (six) hours as needed for mild pain or moderate pain. 08/09/15   Clayton Bibles, PA-C  NON FORMULARY Allergy shots    [provider]  SUMAtriptan (IMITREX) 100 MG tablet Take 100 mg by mouth daily as needed. 08/13/16   [provider]    Family History  Family History  Problem Relation Age of Onset  . Uterine cancer Mother   . Colon polyps Mother   . Heart disease Mother   . Irritable bowel syndrome Mother   . Cancer Mother        uterine  . Hypertension Mother   . COPD Mother   . Emphysema Father   . Hypertension Father   . Alcohol abuse Father   . COPD Brother   . Diabetes Brother   . Sleep apnea Brother   . Irritable bowel syndrome Maternal Aunt   . Irritable bowel syndrome Cousin        x 4  . Cancer Cousin        breast  . Cancer Paternal Grandmother        breast  . Colon cancer Neg Hx   . Esophageal cancer Neg Hx   . Rectal cancer Neg  Hx   . Stomach cancer Neg Hx     Social History Social History   Tobacco Use  . Smoking status: Never Smoker  . Smokeless tobacco: Never Used  Substance Use Topics  . Alcohol use: Yes    Comment: very rare  . Drug use: No     Allergies   Patient has no known allergies.   Review of Systems Review of Systems  Skin: Positive for wound.  Neurological: Positive for light-headedness.  All other systems reviewed and are negative.    Physical Exam Triage Vital Signs ED Triage Vitals  Enc Vitals Group     BP 11/25/19 1900 103/68     Pulse Rate 11/25/19 1900 70     Resp 11/25/19 1900 (!) 22     Temp 11/25/19 1900 98.2 F (36.8 C)     Temp Source 11/25/19 1900 Oral     SpO2 11/25/19 1900 100 %     Weight --      Height --      Head Circumference --      Peak Flow --      Pain Score 11/25/19 1856 10     Pain Loc --      Pain Edu? --      Excl. in [place]? --    No data found.  Updated Vital Signs BP 103/68 (BP Location: Left Arm)   Pulse 70   Temp 98.2 F (36.8 C) (Oral)   Resp (!) 22   SpO2 100%    Physical Exam Vitals and nursing note reviewed.  Constitutional:      General: She is in acute distress.     Appearance: Normal appearance. She is normal weight. She is not toxic-appearing.  Eyes:     Conjunctiva/sclera: Conjunctivae normal.  Cardiovascular:     Rate and Rhythm: Normal rate.  Pulmonary:     Effort: Pulmonary effort is normal.  Musculoskeletal:        General: Tenderness and signs of injury present. No swelling or deformity.     Cervical back: Normal range of motion and neck supple.  Skin:    General: Skin is warm and dry.     Comments: Right thumb shows subungual hematoma covering lunula.  Neurological:     General: No focal deficit present.     Mental Status: She is alert.  Psychiatric:     Comments: anxious    After anesthetizing the distal phalanx with local anesthesia (2% Xylocaine), the subungual hematoma was released using a heated  probe  UC Treatments / Results  Labs (all labs  ordered are listed, but only abnormal results are displayed) Labs Reviewed - No data to display  EKG   Radiology DG Hand Complete Right  Result Date: 11/25/2019 CLINICAL DATA:  Closed hand in car door EXAM: RIGHT HAND - COMPLETE 3+ VIEW COMPARISON:  None. FINDINGS: No acute bony abnormality. Specifically, no fracture, subluxation, or dislocation. Early osteoarthritis changes in the D IP joints and 1st carpometacarpal joint. IMPRESSION: No acute bony abnormality. Electronically Signed   By: Rolm Baptise M.D.   On: 11/25/2019 19:14    Procedures Procedures (including critical care time)  Medications Ordered in UC Medications - No data to display  Initial Impression / Assessment and Plan / UC Course  I have reviewed the triage vital signs and the nursing notes.  Pertinent labs & imaging results that were available during my care of the patient were reviewed by me and considered in my medical decision making (see chart for details).    Final Clinical Impressions(s) / UC Diagnoses   Final diagnoses:  Subungual hematoma of right thumb, initial encounter   Discharge Instructions   None    ED Prescriptions    Medication Sig Dispense Auth. Provider   HYDROcodone-acetaminophen (NORCO) 5-325 MG tablet Take 1 tablet by mouth every 6 (six) hours as needed for moderate pain. 12 tablet Robyn Haber, MD     I have reviewed the PDMP during this encounter.   Robyn Haber, MD 11/25/19 [phone]

## 2019-12-07 DIAGNOSIS — F329 Major depressive disorder, single episode, unspecified: Secondary | ICD-10-CM | POA: Diagnosis not present

## 2019-12-29 DIAGNOSIS — J301 Allergic rhinitis due to pollen: Secondary | ICD-10-CM | POA: Diagnosis not present

## 2020-01-04 DIAGNOSIS — J301 Allergic rhinitis due to pollen: Secondary | ICD-10-CM | POA: Diagnosis not present

## 2020-01-11 DIAGNOSIS — F329 Major depressive disorder, single episode, unspecified: Secondary | ICD-10-CM | POA: Diagnosis not present

## 2020-04-09 DIAGNOSIS — H40013 Open angle with borderline findings, low risk, bilateral: Secondary | ICD-10-CM | POA: Diagnosis not present

## 2020-04-28 DIAGNOSIS — Z23 Encounter for immunization: Secondary | ICD-10-CM | POA: Diagnosis not present

## 2020-05-01 DIAGNOSIS — Z23 Encounter for immunization: Secondary | ICD-10-CM | POA: Diagnosis not present

## 2020-05-26 DIAGNOSIS — Z23 Encounter for immunization: Secondary | ICD-10-CM | POA: Diagnosis not present

## 2020-05-31 DIAGNOSIS — J301 Allergic rhinitis due to pollen: Secondary | ICD-10-CM | POA: Diagnosis not present

## 2020-06-13 DIAGNOSIS — J301 Allergic rhinitis due to pollen: Secondary | ICD-10-CM | POA: Diagnosis not present

## 2020-06-15 DIAGNOSIS — M19071 Primary osteoarthritis, right ankle and foot: Secondary | ICD-10-CM | POA: Diagnosis not present

## 2020-06-15 DIAGNOSIS — M67371 Transient synovitis, right ankle and foot: Secondary | ICD-10-CM | POA: Diagnosis not present

## 2020-06-22 DIAGNOSIS — M67371 Transient synovitis, right ankle and foot: Secondary | ICD-10-CM | POA: Diagnosis not present

## 2020-07-03 DIAGNOSIS — G5602 Carpal tunnel syndrome, left upper limb: Secondary | ICD-10-CM | POA: Diagnosis not present

## 2020-07-03 DIAGNOSIS — G43009 Migraine without aura, not intractable, without status migrainosus: Secondary | ICD-10-CM | POA: Diagnosis not present

## 2020-07-03 DIAGNOSIS — R202 Paresthesia of skin: Secondary | ICD-10-CM | POA: Diagnosis not present

## 2020-07-03 DIAGNOSIS — M5417 Radiculopathy, lumbosacral region: Secondary | ICD-10-CM | POA: Diagnosis not present

## 2020-07-03 DIAGNOSIS — M25512 Pain in left shoulder: Secondary | ICD-10-CM | POA: Diagnosis not present

## 2020-07-03 DIAGNOSIS — M5412 Radiculopathy, cervical region: Secondary | ICD-10-CM | POA: Diagnosis not present

## 2020-07-04 DIAGNOSIS — M67371 Transient synovitis, right ankle and foot: Secondary | ICD-10-CM | POA: Diagnosis not present

## 2020-07-04 DIAGNOSIS — M7751 Other enthesopathy of right foot: Secondary | ICD-10-CM | POA: Diagnosis not present

## 2020-07-11 DIAGNOSIS — G43009 Migraine without aura, not intractable, without status migrainosus: Secondary | ICD-10-CM | POA: Diagnosis not present

## 2020-07-11 DIAGNOSIS — M542 Cervicalgia: Secondary | ICD-10-CM | POA: Diagnosis not present

## 2020-07-11 DIAGNOSIS — M545 Low back pain, unspecified: Secondary | ICD-10-CM | POA: Diagnosis not present

## 2020-07-11 DIAGNOSIS — R270 Ataxia, unspecified: Secondary | ICD-10-CM | POA: Diagnosis not present

## 2020-07-11 DIAGNOSIS — R201 Hypoesthesia of skin: Secondary | ICD-10-CM | POA: Diagnosis not present

## 2020-08-07 DIAGNOSIS — G5602 Carpal tunnel syndrome, left upper limb: Secondary | ICD-10-CM | POA: Diagnosis not present

## 2020-08-07 DIAGNOSIS — R252 Cramp and spasm: Secondary | ICD-10-CM | POA: Diagnosis not present

## 2020-08-07 DIAGNOSIS — M542 Cervicalgia: Secondary | ICD-10-CM | POA: Diagnosis not present

## 2020-08-07 DIAGNOSIS — G43009 Migraine without aura, not intractable, without status migrainosus: Secondary | ICD-10-CM | POA: Diagnosis not present

## 2020-08-07 DIAGNOSIS — M545 Low back pain, unspecified: Secondary | ICD-10-CM | POA: Diagnosis not present

## 2020-09-05 DIAGNOSIS — G5603 Carpal tunnel syndrome, bilateral upper limbs: Secondary | ICD-10-CM | POA: Diagnosis not present

## 2020-09-05 DIAGNOSIS — M542 Cervicalgia: Secondary | ICD-10-CM | POA: Diagnosis not present

## 2020-09-05 DIAGNOSIS — R202 Paresthesia of skin: Secondary | ICD-10-CM | POA: Diagnosis not present

## 2020-09-05 DIAGNOSIS — M545 Low back pain, unspecified: Secondary | ICD-10-CM | POA: Diagnosis not present

## 2020-09-05 DIAGNOSIS — R252 Cramp and spasm: Secondary | ICD-10-CM | POA: Diagnosis not present

## 2020-10-17 DIAGNOSIS — M79641 Pain in right hand: Secondary | ICD-10-CM | POA: Diagnosis not present

## 2020-10-17 DIAGNOSIS — R202 Paresthesia of skin: Secondary | ICD-10-CM | POA: Diagnosis not present

## 2020-10-17 DIAGNOSIS — G5602 Carpal tunnel syndrome, left upper limb: Secondary | ICD-10-CM | POA: Diagnosis not present

## 2020-10-17 DIAGNOSIS — M79642 Pain in left hand: Secondary | ICD-10-CM | POA: Diagnosis not present

## 2020-10-17 DIAGNOSIS — R200 Anesthesia of skin: Secondary | ICD-10-CM | POA: Diagnosis not present

## 2020-10-18 DIAGNOSIS — J301 Allergic rhinitis due to pollen: Secondary | ICD-10-CM | POA: Diagnosis not present

## 2020-10-22 DIAGNOSIS — J301 Allergic rhinitis due to pollen: Secondary | ICD-10-CM | POA: Diagnosis not present

## 2020-10-29 DIAGNOSIS — J301 Allergic rhinitis due to pollen: Secondary | ICD-10-CM | POA: Diagnosis not present

## 2020-11-12 DIAGNOSIS — N390 Urinary tract infection, site not specified: Secondary | ICD-10-CM | POA: Diagnosis not present

## 2020-11-12 DIAGNOSIS — R5383 Other fatigue: Secondary | ICD-10-CM | POA: Diagnosis not present

## 2020-11-12 DIAGNOSIS — E785 Hyperlipidemia, unspecified: Secondary | ICD-10-CM | POA: Diagnosis not present

## 2020-11-12 DIAGNOSIS — R252 Cramp and spasm: Secondary | ICD-10-CM | POA: Diagnosis not present

## 2020-11-12 DIAGNOSIS — R002 Palpitations: Secondary | ICD-10-CM | POA: Diagnosis not present

## 2020-11-12 DIAGNOSIS — R319 Hematuria, unspecified: Secondary | ICD-10-CM | POA: Diagnosis not present

## 2020-11-12 DIAGNOSIS — I129 Hypertensive chronic kidney disease with stage 1 through stage 4 chronic kidney disease, or unspecified chronic kidney disease: Secondary | ICD-10-CM | POA: Diagnosis not present

## 2020-11-12 DIAGNOSIS — N1831 Chronic kidney disease, stage 3a: Secondary | ICD-10-CM | POA: Diagnosis not present

## 2020-11-12 DIAGNOSIS — R0602 Shortness of breath: Secondary | ICD-10-CM | POA: Diagnosis not present

## 2020-11-12 DIAGNOSIS — G43909 Migraine, unspecified, not intractable, without status migrainosus: Secondary | ICD-10-CM | POA: Diagnosis not present

## 2020-11-14 DIAGNOSIS — E785 Hyperlipidemia, unspecified: Secondary | ICD-10-CM | POA: Diagnosis not present

## 2020-11-21 ENCOUNTER — Other Ambulatory Visit: Payer: Self-pay | Admitting: Internal Medicine

## 2020-11-21 DIAGNOSIS — R252 Cramp and spasm: Secondary | ICD-10-CM | POA: Diagnosis not present

## 2020-11-21 DIAGNOSIS — N1831 Chronic kidney disease, stage 3a: Secondary | ICD-10-CM | POA: Diagnosis not present

## 2020-11-21 DIAGNOSIS — R319 Hematuria, unspecified: Secondary | ICD-10-CM | POA: Diagnosis not present

## 2020-11-21 DIAGNOSIS — R7989 Other specified abnormal findings of blood chemistry: Secondary | ICD-10-CM

## 2020-11-21 DIAGNOSIS — N390 Urinary tract infection, site not specified: Secondary | ICD-10-CM | POA: Diagnosis not present

## 2020-11-21 DIAGNOSIS — E785 Hyperlipidemia, unspecified: Secondary | ICD-10-CM | POA: Diagnosis not present

## 2020-11-21 DIAGNOSIS — R002 Palpitations: Secondary | ICD-10-CM | POA: Diagnosis not present

## 2020-11-21 DIAGNOSIS — G43909 Migraine, unspecified, not intractable, without status migrainosus: Secondary | ICD-10-CM | POA: Diagnosis not present

## 2020-11-21 DIAGNOSIS — Z0000 Encounter for general adult medical examination without abnormal findings: Secondary | ICD-10-CM | POA: Diagnosis not present

## 2020-11-21 DIAGNOSIS — I129 Hypertensive chronic kidney disease with stage 1 through stage 4 chronic kidney disease, or unspecified chronic kidney disease: Secondary | ICD-10-CM | POA: Diagnosis not present

## 2020-11-21 DIAGNOSIS — F39 Unspecified mood [affective] disorder: Secondary | ICD-10-CM | POA: Diagnosis not present

## 2020-11-21 DIAGNOSIS — M4800 Spinal stenosis, site unspecified: Secondary | ICD-10-CM | POA: Diagnosis not present

## 2020-11-22 ENCOUNTER — Ambulatory Visit
Admission: RE | Admit: 2020-11-22 | Discharge: 2020-11-22 | Disposition: A | Discharge status: Home / Self Care | Payer: Medicare Other | Source: Ambulatory Visit | Attending: Internal Medicine | Admitting: Internal Medicine

## 2020-11-22 DIAGNOSIS — R945 Abnormal results of liver function studies: Secondary | ICD-10-CM | POA: Diagnosis not present

## 2020-11-22 DIAGNOSIS — R7989 Other specified abnormal findings of blood chemistry: Secondary | ICD-10-CM

## 2020-11-30 DIAGNOSIS — E785 Hyperlipidemia, unspecified: Secondary | ICD-10-CM | POA: Diagnosis not present

## 2020-11-30 DIAGNOSIS — R7401 Elevation of levels of liver transaminase levels: Secondary | ICD-10-CM | POA: Diagnosis not present

## 2020-12-14 DIAGNOSIS — E785 Hyperlipidemia, unspecified: Secondary | ICD-10-CM | POA: Diagnosis not present

## 2020-12-17 ENCOUNTER — Ambulatory Visit (INDEPENDENT_AMBULATORY_CARE_PROVIDER_SITE_OTHER): Payer: Medicare Other | Admitting: Physician Assistant

## 2020-12-17 ENCOUNTER — Other Ambulatory Visit: Payer: Self-pay

## 2020-12-17 ENCOUNTER — Encounter: Payer: Self-pay | Admitting: Physician Assistant

## 2020-12-17 VITALS — BP 116/78 | HR 80 | Ht 63.0 in | Wt 189.0 lb

## 2020-12-17 DIAGNOSIS — R5383 Other fatigue: Secondary | ICD-10-CM

## 2020-12-17 DIAGNOSIS — R1084 Generalized abdominal pain: Secondary | ICD-10-CM

## 2020-12-17 DIAGNOSIS — R140 Abdominal distension (gaseous): Secondary | ICD-10-CM | POA: Diagnosis not present

## 2020-12-17 DIAGNOSIS — R194 Change in bowel habit: Secondary | ICD-10-CM

## 2020-12-17 DIAGNOSIS — R7989 Other specified abnormal findings of blood chemistry: Secondary | ICD-10-CM

## 2020-12-17 DIAGNOSIS — R112 Nausea with vomiting, unspecified: Secondary | ICD-10-CM

## 2020-12-17 MED ORDER — PANTOPRAZOLE SODIUM 40 MG PO TBEC: 40.0000 mg | DELAYED_RELEASE_TABLET | Freq: Two times a day (BID) | ORAL | 5 refills | Status: AC

## 2020-12-17 NOTE — Progress Notes (Addendum)
Chief Complaint: Abdominal pain, bloating, fatigue, nausea and vomiting, GERD  HPI:    [name] is a 70 year old female with a past medical history as listed below including IBS and reflux, chronic cough, gallbladder adenomyomatosis and family history of colon polyps, known to Dr. Hilarie Fredrickson, who was referred to me by Prince Solian, MD for abdominal pain, bloating, fatigue, nausea and vomiting and GERD    12/02/2012 colonoscopy normal with only small internal hemorrhoids.  Repeat recommended in 5 years given family history of precancerous polyps.    06/04/2017 patient seen in clinic by Dr. Hilarie Fredrickson.  That time was noted that we had evaluated her LPR type symptoms and cough with upper endoscopy as well as esophageal manometry with 24-hour pH and impedance testing.  The pH study was performed in July 2018 and manometry was normal with no significant peristaltic abnormality.  24-hour pH and patient showed excellent reflux control with normal esophageal acid exposure in both upright and supine position.  At that time her cough symptoms and throat clearing had continued.  She is going back to allergy and immunology for evaluation of allergic type response and asthma.  Described intermittent "catching" type right upper quadrant pain worse with bending over while gardening and also even while painting her toenails.  Apparently gallbladder was evaluated for adenomyomatosis a few years ago by Dr. Donne Hazel and she discussed cholecystectomy but she preferred to avoid this.  MRI performed earlier that year showed stable adenomyomatosis with no alarming features at that time she was decreased to pantoprazole 40 mg once daily.  It was thought her right upper quadrant pain was musculoskeletal in nature.  Noted she was due for screening colonoscopy in March 2019.    11/22/2019 CT abdomen pelvis with contrast showed no acute abdominal or pelvic pathology.  Aortic atherosclerosis.    11/22/2020 ultrasound of the right upper  quadrant with a 5 mm echogenic focus along the anterior wall of the gallbladder thought likely a small polyp.    Today, the patient presents to clinic accompanied by her husband who does assist with history.  She explains that since January she has just felt sick and had no energy at all.  Initially she was sleeping all the time, she may then feel slightly better for a couple of days but then would go back down.  She is still "laying around" all the time because she does not have energy to do anything that "does not need to be done".  Her husband tells me that she just feels bad literally every second.  When she stands up to walk she feels like her back hurts so much that it is going to "break in two".  She saw her PCP in March finally after laying around for 2 months and had labs which showed an elevated white count per her and they thought maybe she had a UTI.  She was started on some antibiotics for a few days but the culture then returned negative.  She was also found to have elevated LFTs and had an ultrasound as above which was normal.  Patient tells me that one of her numbers "came back as high as 288", she had these rechecked about 2 weeks ago and was told that they are returning more to normal.  (We do not have any of these labs).  Tells me that she constantly feels "9 months pregnant", with a stretching distention all over her abdomen.  Also tells me she has had a change in bowel  habits.  Apparently used to have a bowel movement about 30 to 45 minutes after eating and now every time she pees she "stools a little bit", and it is thin and smooth in texture unlike previous when it "had ripples".  Also describes constant nausea and remains with her chronic cough.  When she does start a coughing fit she will vomit.  Describes seeing an allergist for her cough but they still really have not decided what is causing it.    Describes gaining at least 12 pounds recently.  Her husband is very worried and says "she  just cannot go on like this".    Denies fever, chills or blood in her stool.  Past Medical History:  Diagnosis Date  . Allergic rhinitis   . Anxiety   . Depression   . GERD (gastroesophageal reflux disease)   . H/O colonoscopy    5 14 09 Dr Oletta Lamas  . Hiatal hernia   . Hypertension   . IBS (irritable bowel syndrome)   . Internal hemorrhoids   . Irregular heart beats   . Migraine   . Spinal stenosis     Past Surgical History:  Procedure Laterality Date  . [place] STUDY N/A 03/16/2017   Procedure: [place] STUDY;  Surgeon: Jerene Bears, MD;  Location: [facility];  Service: Gastroenterology;  Laterality: N/A;  . COLONOSCOPY    . ESOPHAGEAL MANOMETRY N/A 03/16/2017   Procedure: ESOPHAGEAL MANOMETRY (EM);  Surgeon: Jerene Bears, MD;  Location: [facility];  Service: Gastroenterology;  Laterality: N/A;  . TONSILLECTOMY    . VAGINAL HYSTERECTOMY    . WISDOM TOOTH EXTRACTION      Current Outpatient Medications  Medication Sig Dispense Refill  . AMBULATORY NON FORMULARY MEDICATION once a week. Allergy shot    . atorvastatin (LIPITOR) 20 MG tablet Take 20 mg by mouth daily.    . citalopram (CELEXA) 10 MG tablet Take 10 mg by mouth daily.    Marland Kitchen gabapentin (NEURONTIN) 300 MG capsule TAKE 1 CAPSULE (300 MG TOTAL) BY MOUTH 3 TIMES DAILY    . HYDROcodone-acetaminophen (NORCO) 5-325 MG tablet Take 1 tablet by mouth every 6 (six) hours as needed for moderate pain. 12 tablet 0  . ibuprofen (ADVIL,MOTRIN) 600 MG tablet Take 1 tablet (600 mg total) by mouth every 6 (six) hours as needed for mild pain or moderate pain. 15 tablet 0  . irbesartan-hydrochlorothiazide (AVALIDE) 150-12.5 MG tablet Take 1 tablet by mouth daily. for blood pressure  4  . montelukast (SINGULAIR) 10 MG tablet Take 10 mg by mouth at bedtime.    . NON FORMULARY Allergy shots    . pantoprazole (PROTONIX) 40 MG tablet Take 1 tablet (40 mg total) by mouth daily. 60 tablet 3  . rosuvastatin (CRESTOR) 5 MG tablet Take 5  mg by mouth daily.    . SUMAtriptan (IMITREX) 100 MG tablet Take 100 mg by mouth daily as needed.  4   Current Facility-Administered Medications  Medication Dose Route Frequency Provider Last Rate Last Admin  . 0.9 %  sodium chloride infusion  500 mL Intravenous Continuous Pyrtle, Lajuan Lines, MD        Allergies as of 12/17/2020  . (No Known Allergies)    Family History  Problem Relation Age of Onset  . Uterine cancer Mother   . Colon polyps Mother   . Heart disease Mother   . Irritable bowel syndrome Mother   . Cancer Mother  uterine  . Hypertension Mother   . COPD Mother   . Emphysema Father   . Hypertension Father   . Alcohol abuse Father   . COPD Brother   . Diabetes Brother   . Sleep apnea Brother   . Irritable bowel syndrome Maternal Aunt   . Irritable bowel syndrome Cousin        x 4  . Cancer Cousin        breast  . Cancer Paternal Grandmother        breast  . Colon cancer Neg Hx   . Esophageal cancer Neg Hx   . Rectal cancer Neg Hx   . Stomach cancer Neg Hx     Social History   Socioeconomic History  . Marital status: Married    Spouse name: Not on file  . Number of children: Not on file  . Years of education: Not on file  . Highest education level: Not on file  Occupational History  . Not on file  Tobacco Use  . Smoking status: Never Smoker  . Smokeless tobacco: Never Used  Vaping Use  . Vaping Use: Never used  Substance and Sexual Activity  . Alcohol use: Yes    Comment: very rare  . Drug use: No  . Sexual activity: Not on file  Other Topics Concern  . Not on file  Social History Narrative  . Not on file   Social Determinants of Health   Financial Resource Strain: Not on file  Food Insecurity: Not on file  Transportation Needs: Not on file  Physical Activity: Not on file  Stress: Not on file  Social Connections: Not on file  Intimate Partner Violence: Not on file    Review of Systems:    Constitutional: No weight loss, fever or  chills Skin: No rash Cardiovascular: No chest pain   Respiratory: No SOB Gastrointestinal: See HPI and otherwise negative Genitourinary: No dysuria Neurological: No headache Musculoskeletal: +back pain Hematologic: No bleeding Psychiatric: No history of depression or anxiety   Physical Exam:  Vital signs: BP 116/78   Pulse 80   Ht 5\' 3"  (1.6 m)   Wt 189 lb (85.7 kg)   BMI 33.48 kg/m   Constitutional:   Pleasant Caucasian female appears to be in NAD, Well developed, Well nourished, alert and cooperative Head:  Normocephalic and atraumatic. Eyes:   PEERL, EOMI. No icterus. Conjunctiva pink. Ears:  Normal auditory acuity. Neck:  Supple Throat: Oral cavity and pharynx without inflammation, swelling or lesion.  Respiratory: Respirations even and unlabored. Lungs clear to auscultation bilaterally.   No wheezes, crackles, or rhonchi.  Cardiovascular: Normal S1, S2. No MRG. Regular rate and rhythm. No peripheral edema, cyanosis or pallor.  Gastrointestinal:  Soft, nondistended,moderate RUQ ttp (hard to tell if over ribs or abdomen as both hurt) with some involuntary guarding. Normal bowel sounds. No appreciable masses or hepatomegaly. Rectal:  Not performed.  Msk:  Symmetrical without gross deformities. Without edema, no deformity or joint abnormality.  Neurologic:  Alert and  oriented x4;  grossly normal neurologically.  Skin:   Dry and intact without significant lesions or rashes. Psychiatric: Demonstrates good judgement and reason without abnormal affect or behaviors.  Requesting recent labs.  Assessment: 1.  Abdominal pain: Generalized pain/bloating/distention per the patient, also pain to palpation in the right upper quadrant which is chronic; uncertain etiology, consider gastritis+/-constipation+/-IBS versus other 2.  Elevated LFTs: Per patient, we do not have labs, we are requesting them today 3.  Nausea/vomiting: Constant nausea which the patient feels is related to reflux  which is uncontrolled on Pantoprazole 40 mg once daily, only vomits when she coughs 4.  Fatigue: Likely related to all of above 5. Change in bowel habits  Plan: 1.  The patient and her husband are very frustrated that she has felt bad for so long and are wanting things done soon so that she can get to feeling better.  We had a long discussion about her adenomyomatosis, I am not sure that this is giving her all of her symptoms and likely it is not.  I would recommend that we work her up completely before sending her back to the surgeons. 2.  We are requesting recent lab work.  If her LFTs are still elevated this will require further work-up.  We discussed this today. 3.  Recommend this patient start MiraLAX daily, discussed titrating up to 4 times a day to ensure that she is having a good full bowel movement instead of little pieces all the time.  At least then we can make sure that constipation is not playing a role. 4.  Would recommend the patient increase her Protonix back to twice daily, 30-60 minutes before breakfast and dinner.  Prescribed #60 with 5 refills. 5.  Would recommend the patient have an EGD and colonoscopy.  Due to scheduling these would not be until the beginning of May and patient wants things done sooner.  Due to this went ahead and scheduled colonoscopy with Dr. Lyndel Safe tomorrow per patient request.  She will continue to follow with Dr. Hilarie Fredrickson after time of procedures as her primary GI physician.  If she needs an EGD with this will need to be scheduled later. 6.  Provided the patient a detailed list of risks for the procedure and she agrees to proceed 7.  Patient to follow in clinic per recommendations from Dr. Lyndel Safe after time of procedure.  Ellouise Newer, PA-C [facility] 12/17/2020, 10:27 AM  Cc: Prince Solian, MD   12/25/2020 Addendum:  Received records from [facility] in regards to elevated LFTs.  11/14/2020 AST 41, ALT 281, direct  bilirubin 0.4  11/21/2020 CMP with an AST of 100, ALT 288 and otherwise normal.  11/30/2020 AST 55 and ALT 142, direct bilirubin 0.4.  Otherwise normal.  12/01/2020 hepatitis panel normal/negative.  12/14/2020 LFTs with an ALT of 67 and others are mostly normal other than a direct bilirubin at 0.5.  We are repeating hepatic panel now, though it looks like they have been trending down over the past few weeks.  JLL

## 2020-12-17 NOTE — Patient Instructions (Signed)
If you are age 70 or older, your body mass index should be between 23-30. Your Body mass index is 33.48 kg/m. If this is out of the aforementioned range listed, please consider follow up with your Primary Care Provider.  If you are age 64 or younger, your body mass index should be between 19-25. Your Body mass index is 33.48 kg/m. If this is out of the aformentioned range listed, please consider follow up with your Primary Care Provider.   You have been scheduled for a colonoscopy. Please follow written instructions given to you at your visit today.  Please pick up your prep supplies at the pharmacy within the next 1-3 days. If you use inhalers (even only as needed), please bring them with you on the day of your procedure.  We have sent the following medications to your pharmacy for you to pick up at your convenience: Pantoprazole 40  Mg  Start Miralax once daily.  It was a pleasure to see you today!  Ellouise Newer, PA-C

## 2020-12-18 ENCOUNTER — Other Ambulatory Visit: Payer: Self-pay

## 2020-12-18 ENCOUNTER — Ambulatory Visit (AMBULATORY_SURGERY_CENTER): Payer: Medicare Other | Admitting: Gastroenterology

## 2020-12-18 ENCOUNTER — Encounter: Payer: Self-pay | Admitting: Gastroenterology

## 2020-12-18 VITALS — BP 115/52 | HR 67 | Temp 97.7°F | Resp 14 | Ht 63.0 in | Wt 189.0 lb

## 2020-12-18 DIAGNOSIS — Z86010 Personal history of colonic polyps: Secondary | ICD-10-CM | POA: Diagnosis not present

## 2020-12-18 DIAGNOSIS — Z1211 Encounter for screening for malignant neoplasm of colon: Secondary | ICD-10-CM

## 2020-12-18 DIAGNOSIS — Z8371 Family history of colonic polyps: Secondary | ICD-10-CM

## 2020-12-18 DIAGNOSIS — R7989 Other specified abnormal findings of blood chemistry: Secondary | ICD-10-CM

## 2020-12-18 MED ORDER — SODIUM CHLORIDE 0.9 % IV SOLN: 500.0000 mL | Freq: Once | INTRAVENOUS | Status: DC

## 2020-12-18 NOTE — Progress Notes (Signed)
VS by CW. ?

## 2020-12-18 NOTE — Op Note (Signed)
[facility] Patient Name: [name] Procedure Date: 12/18/2020 10:38 AM MRN: [id number] Endoscopist: Jackquline Denmark , MD Age: 70 Referring MD:  Date of Birth: [birth year] Gender: Female Account #: [id number] Procedure:                Colonoscopy Indications:              Colon cancer screening in patient at increased                            risk: Family history of 1st-degree relative with                            colon polyps Medicines:                Monitored Anesthesia Care Procedure:                Pre-Anesthesia Assessment:                           - Prior to the procedure, a History and Physical                            was performed, and patient medications and                            allergies were reviewed. The patient's tolerance of                            previous anesthesia was also reviewed. The risks                            and benefits of the procedure and the sedation                            options and risks were discussed with the patient.                            All questions were answered, and informed consent                            was obtained. Prior Anticoagulants: The patient has                            taken no previous anticoagulant or antiplatelet                            agents. ASA Grade Assessment: II - A patient with                            mild systemic disease. After reviewing the risks                            and benefits, the patient was deemed in  satisfactory condition to undergo the procedure.                           After obtaining informed consent, the colonoscope                            was passed under direct vision. Throughout the                            procedure, the patient's blood pressure, pulse, and                            oxygen saturations were monitored continuously. The                            Olympus PCF-H190DL ([id number]) Colonoscope was                             introduced through the anus and advanced to the 2                            cm into the ileum. The colonoscopy was performed                            without difficulty. The patient tolerated the                            procedure well. The quality of the bowel                            preparation was adequate to identify polyps. Some                            retained solid vegetable material and stool was                            found in the colon. Aggressive suctioning and                            aspiration was performed. Overall the examination                            was adequate. Over 90 to 95% of the colonic mucosa                            was visualized satisfactorily. The terminal ileum,                            ileocecal valve, appendiceal orifice, and rectum                            were photographed. Scope In: 10:47:42 AM Scope Out: 11:02:43 AM Scope Withdrawal Time: 0 hours 9 minutes 31 seconds  Total Procedure Duration: 0 hours  15 minutes 1 second  Findings:                 A few rare (2-3) small-mouthed diverticula were                            found in the sigmoid colon. Otherwise normal                            colonoscopy. The colon was somewhat redundant.                           Non-bleeding internal hemorrhoids were found during                            retroflexion. The hemorrhoids were small.                           The terminal ileum appeared normal.                           The exam was otherwise without abnormality on                            direct and retroflexion views. Complications:            No immediate complications. Estimated Blood Loss:     Estimated blood loss: none. Impression:               - Very minimal sigmoid diverticulosis.                           - Non-bleeding internal hemorrhoids.                           - The examined portion of the ileum was normal.                           -  The examination was otherwise normal on direct                            and retroflexion views.                           - No specimens collected. Recommendation:           - Patient has a contact number available for                            emergencies. The signs and symptoms of potential                            delayed complications were discussed with the                            patient. Return to normal activities tomorrow.  Written discharge instructions were provided to the                            patient.                           - High fiber diet.                           - Continue present medications.                           - Miralax 1 capful (17 grams) in 8 ounces of water                            PO daily.                           - Repeat colonoscopy is not recommended due to                            current age (70 years or older) for screening                            purposes. Hence repeat colonoscopy only if with any                            new problems.                           - Return to GI office PRN.                           - The findings and recommendations were discussed                            with the patient's family. Jackquline Denmark, MD 12/18/2020 11:10:38 AM This report has been signed electronically.

## 2020-12-18 NOTE — Progress Notes (Signed)
To PACU, VSS. Report to [email or web address] 

## 2020-12-18 NOTE — Patient Instructions (Signed)
Handouts provided on diverticulosis, hemorrhoids and high fiber diet.   Recommend a high fiber diet.  Use Miralax 1 capful (17 grams) in 8 ounces of water by mouth daily.  YOU HAD AN ENDOSCOPIC PROCEDURE TODAY AT [facility]:   Refer to the procedure report that was given to you for any specific questions about what was found during the examination.  If the procedure report does not answer your questions, please call your gastroenterologist to clarify.  If you requested that your care partner not be given the details of your procedure findings, then the procedure report has been included in a sealed envelope for you to review at your convenience later.  YOU SHOULD EXPECT: Some feelings of bloating in the abdomen. Passage of more gas than usual.  Walking can help get rid of the air that was put into your GI tract during the procedure and reduce the bloating. If you had a lower endoscopy (such as a colonoscopy or flexible sigmoidoscopy) you may notice spotting of blood in your stool or on the toilet paper. If you underwent a bowel prep for your procedure, you may not have a normal bowel movement for a few days.  Please Note:  You might notice some irritation and congestion in your nose or some drainage.  This is from the oxygen used during your procedure.  There is no need for concern and it should clear up in a day or so.  SYMPTOMS TO REPORT IMMEDIATELY:   Following lower endoscopy (colonoscopy or flexible sigmoidoscopy):  Excessive amounts of blood in the stool  Significant tenderness or worsening of abdominal pains  Swelling of the abdomen that is new, acute  Fever of 100F or higher  For urgent or emergent issues, a gastroenterologist can be reached at any hour by calling [phone]. Do not use MyChart messaging for urgent concerns.    DIET:  We do recommend a small meal at first, but then you may proceed to your regular diet.  Drink plenty of fluids but you should  avoid alcoholic beverages for 24 hours.  ACTIVITY:  You should plan to take it easy for the rest of today and you should NOT DRIVE or use heavy machinery until tomorrow (because of the sedation medicines used during the test).    FOLLOW UP: Our staff will call the number listed on your records 48-72 hours following your procedure to check on you and address any questions or concerns that you may have regarding the information given to you following your procedure. If we do not reach you, we will leave a message.  We will attempt to reach you two times.  During this call, we will ask if you have developed any symptoms of COVID 19. If you develop any symptoms (ie: fever, flu-like symptoms, shortness of breath, cough etc.) before then, please call [phone].  If you test positive for Covid 19 in the 2 weeks post procedure, please call and report this information to [place].    If any biopsies were taken you will be contacted by phone or by letter within the next 1-3 weeks.  Please call us at [phone] if you have not heard about the biopsies in 3 weeks.    SIGNATURES/CONFIDENTIALITY: You and/or your care partner have signed paperwork which will be entered into your electronic medical record.  These signatures attest to the fact that that the information above on your After Visit Summary has been reviewed and is understood.  Full responsibility  of the confidentiality of this discharge information lies with you and/or your care-partner.

## 2020-12-20 ENCOUNTER — Telehealth: Payer: Self-pay | Admitting: *Deleted

## 2020-12-20 ENCOUNTER — Telehealth: Payer: Self-pay

## 2020-12-20 NOTE — Telephone Encounter (Signed)
  Follow up Call-  Call back number 12/18/2020  Post procedure Call Back phone  # [phone]  Permission to leave phone message Yes  Some recent data might be hidden     Patient questions:  Do you have a fever, pain , or abdominal swelling? No. Pain Score  0 *  Have you tolerated food without any problems? Yes.    Have you been able to return to your normal activities? Yes.    Do you have any questions about your discharge instructions: Diet   No. Medications  No. Follow up visit  No.  Do you have questions or concerns about your Care? No.  Actions: * If pain score is 4 or above: No action needed, pain <4.  1. Have you developed a fever since your procedure? no  2.   Have you had an respiratory symptoms (SOB or cough) since your procedure? no  3.   Have you tested positive for COVID 19 since your procedure no  4.   Have you had any family members/close contacts diagnosed with the COVID 19 since your procedure?  no   If yes to any of these questions please route to Joylene John, RN and Joella Prince, RN

## 2020-12-20 NOTE — Telephone Encounter (Signed)
  Follow up Call-  Call back number 12/18/2020  Post procedure Call Back phone  # [phone]  Permission to leave phone message Yes  Some recent data might be hidden      LMOM to call back with any questions or concerns.  Also, call back if patient has developed fever, respiratory issues or been dx with COVID or had any family members or close contacts diagnosed since her procedure.

## 2020-12-25 NOTE — Progress Notes (Signed)
Addendum: Reviewed and agree with assessment and management plan. Colonoscopy by Dr. Lyndel Safe was normal Did we ever receive her liver enzyme tests from outside provider?  If not she should return for repeat hepatic function panel and INR. EGD can be considered for ongoing symptoms Audray Rumore, Lajuan Lines, MD

## 2021-01-07 ENCOUNTER — Telehealth: Payer: Self-pay | Admitting: Physician Assistant

## 2021-01-07 NOTE — Telephone Encounter (Signed)
Yes, okay for EGD JMP

## 2021-01-07 NOTE — Telephone Encounter (Signed)
Dr. Hilarie Fredrickson, this patient saw [name] on 12/17/2020. At that time EGD/colonoscopy was recommended. Dr. Lyndel Safe completed colonoscopy on 12/18/20 due to sooner availability. Patient is wanting to know if we can go ahead and schedule EGD to further evaluate symptoms? [name] is out of the office until Wednesday. Please advise, thanks.

## 2021-01-07 NOTE — Telephone Encounter (Signed)
Inbound call from patient stating she is still having issues and wants to know if she needs to have the EGD.  Please advise.

## 2021-01-08 NOTE — Telephone Encounter (Signed)
Spoke with patient to schedule EGD in the [place]. Patient has been scheduled for a virtual pre-visit with the nurse on Monday, 01/14/21 at 10:00 AM. Patient is scheduled for an EGD with Dr. Hilarie Fredrickson on Tuesday, 01/22/21 at 10:30 AM, arriving at 9:30 AM with a care partner. Patient verbalized understanding of all information and had no concerns at the end of the call.

## 2021-01-14 ENCOUNTER — Telehealth: Payer: Self-pay

## 2021-01-14 ENCOUNTER — Other Ambulatory Visit: Payer: Self-pay

## 2021-01-14 ENCOUNTER — Ambulatory Visit (AMBULATORY_SURGERY_CENTER): Payer: Medicare Other | Admitting: *Deleted

## 2021-01-14 VITALS — Ht 63.0 in | Wt 185.0 lb

## 2021-01-14 DIAGNOSIS — R7989 Other specified abnormal findings of blood chemistry: Secondary | ICD-10-CM

## 2021-01-14 DIAGNOSIS — R112 Nausea with vomiting, unspecified: Secondary | ICD-10-CM

## 2021-01-14 NOTE — Progress Notes (Signed)

## 2021-01-14 NOTE — Telephone Encounter (Signed)
Spoke with patient and advised that [name] would like her to come in for lab work to recheck her liver enzymes. Patient is aware that no appt is necessary and she can stop by the lab in the basement at her convenience between 7:30 AM - 5 PM. Patient states that she will be out of town starting today and will return late on Wednesday. Patient states that she will come in for labs on Thursday. Patient verbalized understanding of all information and had no concerns at the end of the call.   Lab order and reminder in epic.

## 2021-01-14 NOTE — Telephone Encounter (Signed)
-----   Message from [place], [place] sent at 01/11/2021 11:01 AM EDT ----- Regarding: Repeat Hepatic function panel Can you order repeat hepatic function panel for this patient and let her know I would like her to come in to have this done.  Thanks-JLL

## 2021-01-18 ENCOUNTER — Other Ambulatory Visit: Payer: Medicare Other

## 2021-01-18 ENCOUNTER — Telehealth: Payer: Self-pay

## 2021-01-18 DIAGNOSIS — R7989 Other specified abnormal findings of blood chemistry: Secondary | ICD-10-CM

## 2021-01-18 LAB — HEPATIC FUNCTION PANEL
ALT: 21 U/L (ref 0–35)
AST: 18 U/L (ref 0–37)
Albumin: 4 g/dL (ref 3.5–5.2)
Alkaline Phosphatase: 73 U/L (ref 39–117)
Bilirubin, Direct: 0.2 mg/dL (ref 0.0–0.3)
Total Bilirubin: 0.7 mg/dL (ref 0.2–1.2)
Total Protein: 6.5 g/dL (ref 6.0–8.3)

## 2021-01-18 NOTE — Telephone Encounter (Signed)
Spoke with patient's husband and asked that he remind the patient that she is due for repeat labs at this time. No appointment is necessary. Husband is aware that she can stop by the lab in the basement at her convenience before 5 PM today. Patient's husband verbalized understanding and had no concerns at the end of the call.

## 2021-01-18 NOTE — Telephone Encounter (Signed)
-----   Message from Yevette Edwards, RN sent at 01/14/2021  9:38 AM EDT ----- Regarding: Labs Check to see if patient came in for labs - hepatic function panel

## 2021-01-22 ENCOUNTER — Other Ambulatory Visit: Payer: Self-pay

## 2021-01-22 ENCOUNTER — Telehealth: Payer: Self-pay | Admitting: *Deleted

## 2021-01-22 ENCOUNTER — Ambulatory Visit (AMBULATORY_SURGERY_CENTER): Payer: Medicare Other | Admitting: Internal Medicine

## 2021-01-22 ENCOUNTER — Encounter: Payer: Self-pay | Admitting: Internal Medicine

## 2021-01-22 VITALS — BP 104/46 | HR 62 | Temp 97.9°F | Resp 10 | Ht 63.0 in | Wt 185.0 lb

## 2021-01-22 DIAGNOSIS — K229 Disease of esophagus, unspecified: Secondary | ICD-10-CM

## 2021-01-22 DIAGNOSIS — R112 Nausea with vomiting, unspecified: Secondary | ICD-10-CM

## 2021-01-22 DIAGNOSIS — I129 Hypertensive chronic kidney disease with stage 1 through stage 4 chronic kidney disease, or unspecified chronic kidney disease: Secondary | ICD-10-CM | POA: Diagnosis not present

## 2021-01-22 DIAGNOSIS — K208 Other esophagitis without bleeding: Secondary | ICD-10-CM | POA: Diagnosis not present

## 2021-01-22 DIAGNOSIS — K219 Gastro-esophageal reflux disease without esophagitis: Secondary | ICD-10-CM

## 2021-01-22 DIAGNOSIS — K2950 Unspecified chronic gastritis without bleeding: Secondary | ICD-10-CM | POA: Diagnosis not present

## 2021-01-22 DIAGNOSIS — N183 Chronic kidney disease, stage 3 unspecified: Secondary | ICD-10-CM | POA: Diagnosis not present

## 2021-01-22 DIAGNOSIS — R945 Abnormal results of liver function studies: Secondary | ICD-10-CM | POA: Diagnosis not present

## 2021-01-22 DIAGNOSIS — K2970 Gastritis, unspecified, without bleeding: Secondary | ICD-10-CM | POA: Diagnosis not present

## 2021-01-22 MED ORDER — SODIUM CHLORIDE 0.9 % IV SOLN: 500.0000 mL | Freq: Once | INTRAVENOUS | Status: DC

## 2021-01-22 NOTE — Progress Notes (Signed)
Pt's states no medical or surgical changes since previsit or office visit.  CW vitals and EW vitals.

## 2021-01-22 NOTE — Patient Instructions (Signed)
YOU HAD AN ENDOSCOPIC PROCEDURE TODAY AT THE [facility]:   Refer to the procedure report that was given to you for any specific questions about what was found during the examination.  If the procedure report does not answer your questions, please call your gastroenterologist to clarify.  If you requested that your care partner not be given the details of your procedure findings, then the procedure report has been included in a sealed envelope for you to review at your convenience later.  YOU SHOULD EXPECT: Some feelings of bloating in the abdomen. Passage of more gas than usual.  Walking can help get rid of the air that was put into your GI tract during the procedure and reduce the bloating. If you had a lower endoscopy (such as a colonoscopy or flexible sigmoidoscopy) you may notice spotting of blood in your stool or on the toilet paper. If you underwent a bowel prep for your procedure, you may not have a normal bowel movement for a few days.  Please Note:  You might notice some irritation and congestion in your nose or some drainage.  This is from the oxygen used during your procedure.  There is no need for concern and it should clear up in a day or so.  SYMPTOMS TO REPORT IMMEDIATELY:    Following upper endoscopy (EGD)  Vomiting of blood or coffee ground material  New chest pain or pain under the shoulder blades  Painful or persistently difficult swallowing  New shortness of breath  Fever of 100F or higher  Black, tarry-looking stools  For urgent or emergent issues, a gastroenterologist can be reached at any hour by calling [phone]. Do not use MyChart messaging for urgent concerns.    DIET:  We do recommend a small meal at first, but then you may proceed to your regular diet.  Drink plenty of fluids but you should avoid alcoholic beverages for 24 hours.  ACTIVITY:  You should plan to take it easy for the rest of today and you should NOT DRIVE or use heavy machinery  until tomorrow (because of the sedation medicines used during the test).    FOLLOW UP: Our staff will call the number listed on your records 48-72 hours following your procedure to check on you and address any questions or concerns that you may have regarding the information given to you following your procedure. If we do not reach you, we will leave a message.  We will attempt to reach you two times.  During this call, we will ask if you have developed any symptoms of COVID 19. If you develop any symptoms (ie: fever, flu-like symptoms, shortness of breath, cough etc.) before then, please call [phone].  If you test positive for Covid 19 in the 2 weeks post procedure, please call and report this information to us.    If any biopsies were taken you will be contacted by phone or by letter within the next 1-3 weeks.  Please call us at [phone] if you have not heard about the biopsies in 3 weeks.    SIGNATURES/CONFIDENTIALITY: You and/or your care partner have signed paperwork which will be entered into your electronic medical record.  These signatures attest to the fact that that the information above on your After Visit Summary has been reviewed and is understood.  Full responsibility of the confidentiality of this discharge information lies with you and/or your care-partner. 

## 2021-01-22 NOTE — Telephone Encounter (Signed)
-----   Message from Jerene Bears, MD sent at 01/22/2021 11:32 AM EDT ----- Please refer pt to [facility] for cholecystectomy, recent upper abd pain, n/v and elevated LFTs More chronic RUQ pain Gallbladder adenomyomatosis Pt aware of referral JMP

## 2021-01-22 NOTE — Progress Notes (Signed)
Called to room to assist during endoscopic procedure.  Patient ID and intended procedure confirmed with present staff. Received instructions for my participation in the procedure from the performing physician.  

## 2021-01-22 NOTE — Telephone Encounter (Signed)
31 pages of records, demographic information and insurance information faxed to [facility]. We will await appointment information.

## 2021-01-22 NOTE — Op Note (Signed)
[place] Patient Name: [name] Procedure Date: 01/22/2021 11:01 AM MRN: [id number] Endoscopist: Jerene Bears , MD Age: 70 Referring MD:  Date of Birth: 1950/10/14 Gender: Female Account #: [id number] Procedure:                Upper GI endoscopy Indications:              Epigastric abdominal pain, Gastro-esophageal reflux                            disease, nausea and vomiting, recently elevated LFTs Medicines:                Monitored Anesthesia Care Procedure:                Pre-Anesthesia Assessment:                           - Prior to the procedure, a History and Physical                            was performed, and patient medications and                            allergies were reviewed. The patient's tolerance of                            previous anesthesia was also reviewed. The risks                            and benefits of the procedure and the sedation                            options and risks were discussed with the patient.                            All questions were answered, and informed consent                            was obtained. Prior Anticoagulants: The patient has                            taken no previous anticoagulant or antiplatelet                            agents. ASA Grade Assessment: II - A patient with                            mild systemic disease. After reviewing the risks                            and benefits, the patient was deemed in                            satisfactory condition to undergo the procedure.  After obtaining informed consent, the endoscope was                            passed under direct vision. Throughout the                            procedure, the patient's blood pressure, pulse, and                            oxygen saturations were monitored continuously. The                            Endoscope was introduced through the mouth, and                             advanced to the second part of duodenum. The upper                            GI endoscopy was accomplished without difficulty.                            The patient tolerated the procedure well. Scope In: Scope Out: Findings:                 The Z-line was irregular and was found 39 cm from                            the incisors. Biopsies were taken with a cold                            forceps for histology to exclude Barrett's                            esophagus.                           The entire examined stomach was normal. Biopsies                            were taken with a cold forceps for histology and                            Helicobacter pylori testing.                           The examined duodenum was normal. Complications:            No immediate complications. Estimated Blood Loss:     Estimated blood loss was minimal. Impression:               - Z-line irregular, 39 cm from the incisors.                            Biopsied.                           -  Normal stomach. Biopsied.                           - Normal examined duodenum. Recommendation:           - Patient has a contact number available for                            emergencies. The signs and symptoms of potential                            delayed complications were discussed with the                            patient. Return to normal activities tomorrow.                            Written discharge instructions were provided to the                            patient.                           - Resume previous diet.                           - Continue present medications.                           - Await pathology results.                           - Given improvement in symptoms and normalization                            of liver enzymes I would recommend no further                            evaluation at present. However, if return of upper                            abdominal pain,  nausea, vomiting or elevated liver                            enzymes (in association with other symptoms) I                            would recommend cholecystectomy. Jerene Bears, MD 01/22/2021 11:23:19 AM This report has been signed electronically.

## 2021-01-22 NOTE — Progress Notes (Signed)
Report to PACU, RN, vss, BBS= Clear.  

## 2021-01-24 ENCOUNTER — Telehealth: Payer: Self-pay | Admitting: *Deleted

## 2021-01-24 ENCOUNTER — Telehealth: Payer: Self-pay

## 2021-01-24 NOTE — Telephone Encounter (Signed)
  Follow up Call-  Call back number 01/22/2021 12/18/2020  Post procedure Call Back phone  # [phone]  Permission to leave phone message Yes Yes  Some recent data might be hidden    LMOM to call back with any questions or concerns.  Also, call back if patient has developed fever, respiratory issues or been dx with COVID or had any family members or close contacts diagnosed since her procedure.

## 2021-01-24 NOTE — Telephone Encounter (Signed)
Left message on follow up call. 

## 2021-01-29 NOTE — Telephone Encounter (Signed)
Per [facility], patient has called their office and has been scheduled for appointment with Dr Rosendo Gros on 03/04/21 at 9:20 am.

## 2021-01-30 ENCOUNTER — Encounter: Payer: Self-pay | Admitting: Internal Medicine

## 2021-01-31 DIAGNOSIS — R0602 Shortness of breath: Secondary | ICD-10-CM | POA: Diagnosis not present

## 2021-01-31 DIAGNOSIS — K589 Irritable bowel syndrome without diarrhea: Secondary | ICD-10-CM | POA: Diagnosis not present

## 2021-01-31 DIAGNOSIS — F39 Unspecified mood [affective] disorder: Secondary | ICD-10-CM | POA: Diagnosis not present

## 2021-01-31 DIAGNOSIS — E785 Hyperlipidemia, unspecified: Secondary | ICD-10-CM | POA: Diagnosis not present

## 2021-01-31 DIAGNOSIS — I129 Hypertensive chronic kidney disease with stage 1 through stage 4 chronic kidney disease, or unspecified chronic kidney disease: Secondary | ICD-10-CM | POA: Diagnosis not present

## 2021-01-31 DIAGNOSIS — G43909 Migraine, unspecified, not intractable, without status migrainosus: Secondary | ICD-10-CM | POA: Diagnosis not present

## 2021-01-31 DIAGNOSIS — N1831 Chronic kidney disease, stage 3a: Secondary | ICD-10-CM | POA: Diagnosis not present

## 2021-01-31 DIAGNOSIS — R319 Hematuria, unspecified: Secondary | ICD-10-CM | POA: Diagnosis not present

## 2021-01-31 DIAGNOSIS — R7989 Other specified abnormal findings of blood chemistry: Secondary | ICD-10-CM | POA: Diagnosis not present

## 2021-01-31 DIAGNOSIS — K219 Gastro-esophageal reflux disease without esophagitis: Secondary | ICD-10-CM | POA: Diagnosis not present

## 2021-01-31 DIAGNOSIS — M4800 Spinal stenosis, site unspecified: Secondary | ICD-10-CM | POA: Diagnosis not present

## 2021-02-25 ENCOUNTER — Ambulatory Visit (HOSPITAL_COMMUNITY)
Admission: EM | Admit: 2021-02-25 | Discharge: 2021-02-25 | Discharge status: Against Medical Advice | Payer: Medicare Other

## 2021-02-25 ENCOUNTER — Other Ambulatory Visit: Payer: Self-pay

## 2021-02-25 DIAGNOSIS — K120 Recurrent oral aphthae: Secondary | ICD-10-CM | POA: Diagnosis not present

## 2021-02-25 DIAGNOSIS — L239 Allergic contact dermatitis, unspecified cause: Secondary | ICD-10-CM | POA: Diagnosis not present

## 2021-02-25 DIAGNOSIS — J309 Allergic rhinitis, unspecified: Secondary | ICD-10-CM | POA: Diagnosis not present

## 2021-02-25 NOTE — ED Notes (Signed)
Per Patient Access, pt stated they were leaving.

## 2021-03-04 DIAGNOSIS — R1084 Generalized abdominal pain: Secondary | ICD-10-CM | POA: Diagnosis not present

## 2021-03-06 DIAGNOSIS — R21 Rash and other nonspecific skin eruption: Secondary | ICD-10-CM | POA: Diagnosis not present

## 2021-03-07 DIAGNOSIS — J301 Allergic rhinitis due to pollen: Secondary | ICD-10-CM | POA: Diagnosis not present

## 2021-03-12 ENCOUNTER — Other Ambulatory Visit (HOSPITAL_COMMUNITY): Payer: Self-pay | Admitting: General Surgery

## 2021-03-12 DIAGNOSIS — K819 Cholecystitis, unspecified: Secondary | ICD-10-CM

## 2021-03-12 DIAGNOSIS — K831 Obstruction of bile duct: Secondary | ICD-10-CM

## 2021-03-12 DIAGNOSIS — R109 Unspecified abdominal pain: Secondary | ICD-10-CM

## 2021-03-12 DIAGNOSIS — R1084 Generalized abdominal pain: Secondary | ICD-10-CM

## 2021-03-13 DIAGNOSIS — J301 Allergic rhinitis due to pollen: Secondary | ICD-10-CM | POA: Diagnosis not present

## 2021-03-22 ENCOUNTER — Other Ambulatory Visit: Payer: Self-pay

## 2021-03-22 ENCOUNTER — Encounter (HOSPITAL_COMMUNITY)
Admission: RE | Admit: 2021-03-22 | Discharge: 2021-03-22 | Disposition: A | Discharge status: Home / Self Care | Payer: Medicare Other | Source: Ambulatory Visit | Attending: General Surgery | Admitting: General Surgery

## 2021-03-22 DIAGNOSIS — R109 Unspecified abdominal pain: Secondary | ICD-10-CM | POA: Insufficient documentation

## 2021-03-22 DIAGNOSIS — K819 Cholecystitis, unspecified: Secondary | ICD-10-CM | POA: Insufficient documentation

## 2021-03-22 DIAGNOSIS — K831 Obstruction of bile duct: Secondary | ICD-10-CM | POA: Diagnosis not present

## 2021-03-22 DIAGNOSIS — R1084 Generalized abdominal pain: Secondary | ICD-10-CM

## 2021-03-22 DIAGNOSIS — R112 Nausea with vomiting, unspecified: Secondary | ICD-10-CM | POA: Diagnosis not present

## 2021-03-22 MED ORDER — TECHNETIUM TC 99M MEBROFENIN IV KIT
5.0000 | PACK | Freq: Once | INTRAVENOUS | Status: AC | PRN
  Administered 2021-03-22: 5 via INTRAVENOUS

## 2021-03-27 ENCOUNTER — Other Ambulatory Visit: Payer: Self-pay

## 2021-03-27 ENCOUNTER — Encounter (HOSPITAL_COMMUNITY)
Admission: RE | Admit: 2021-03-27 | Discharge: 2021-03-27 | Disposition: A | Discharge status: Home / Self Care | Payer: Medicare Other | Source: Ambulatory Visit | Attending: General Surgery | Admitting: General Surgery

## 2021-03-27 DIAGNOSIS — R1084 Generalized abdominal pain: Secondary | ICD-10-CM | POA: Insufficient documentation

## 2021-03-27 DIAGNOSIS — K219 Gastro-esophageal reflux disease without esophagitis: Secondary | ICD-10-CM | POA: Diagnosis not present

## 2021-03-27 MED ORDER — TECHNETIUM TC 99M SULFUR COLLOID
2.2000 | Freq: Once | INTRAVENOUS | Status: AC | PRN
  Administered 2021-03-27: 2.2 via INTRAVENOUS

## 2021-04-08 DIAGNOSIS — G43909 Migraine, unspecified, not intractable, without status migrainosus: Secondary | ICD-10-CM | POA: Diagnosis not present

## 2021-04-08 DIAGNOSIS — G2581 Restless legs syndrome: Secondary | ICD-10-CM | POA: Diagnosis not present

## 2021-04-08 DIAGNOSIS — M7989 Other specified soft tissue disorders: Secondary | ICD-10-CM | POA: Diagnosis not present

## 2021-04-08 DIAGNOSIS — M25559 Pain in unspecified hip: Secondary | ICD-10-CM | POA: Diagnosis not present

## 2021-04-08 DIAGNOSIS — E785 Hyperlipidemia, unspecified: Secondary | ICD-10-CM | POA: Diagnosis not present

## 2021-04-08 DIAGNOSIS — I10 Essential (primary) hypertension: Secondary | ICD-10-CM | POA: Diagnosis not present

## 2021-04-08 DIAGNOSIS — R5383 Other fatigue: Secondary | ICD-10-CM | POA: Diagnosis not present

## 2021-04-08 DIAGNOSIS — R21 Rash and other nonspecific skin eruption: Secondary | ICD-10-CM | POA: Diagnosis not present

## 2021-04-08 DIAGNOSIS — F419 Anxiety disorder, unspecified: Secondary | ICD-10-CM | POA: Diagnosis not present

## 2021-04-08 DIAGNOSIS — E538 Deficiency of other specified B group vitamins: Secondary | ICD-10-CM | POA: Diagnosis not present

## 2021-04-10 DIAGNOSIS — L738 Other specified follicular disorders: Secondary | ICD-10-CM | POA: Diagnosis not present

## 2021-04-10 DIAGNOSIS — M160 Bilateral primary osteoarthritis of hip: Secondary | ICD-10-CM | POA: Diagnosis not present

## 2021-04-10 DIAGNOSIS — M1611 Unilateral primary osteoarthritis, right hip: Secondary | ICD-10-CM | POA: Diagnosis not present

## 2021-04-10 DIAGNOSIS — E538 Deficiency of other specified B group vitamins: Secondary | ICD-10-CM | POA: Diagnosis not present

## 2021-04-10 DIAGNOSIS — M47816 Spondylosis without myelopathy or radiculopathy, lumbar region: Secondary | ICD-10-CM | POA: Diagnosis not present

## 2021-05-06 DIAGNOSIS — R1010 Upper abdominal pain, unspecified: Secondary | ICD-10-CM | POA: Diagnosis not present

## 2021-05-07 DIAGNOSIS — Z6831 Body mass index (BMI) 31.0-31.9, adult: Secondary | ICD-10-CM | POA: Diagnosis not present

## 2021-05-07 DIAGNOSIS — Z1231 Encounter for screening mammogram for malignant neoplasm of breast: Secondary | ICD-10-CM | POA: Diagnosis not present

## 2021-05-07 DIAGNOSIS — Z124 Encounter for screening for malignant neoplasm of cervix: Secondary | ICD-10-CM | POA: Diagnosis not present

## 2021-05-07 DIAGNOSIS — Z1272 Encounter for screening for malignant neoplasm of vagina: Secondary | ICD-10-CM | POA: Diagnosis not present

## 2021-05-28 DIAGNOSIS — M4800 Spinal stenosis, site unspecified: Secondary | ICD-10-CM | POA: Diagnosis not present

## 2021-05-28 DIAGNOSIS — R7989 Other specified abnormal findings of blood chemistry: Secondary | ICD-10-CM | POA: Diagnosis not present

## 2021-05-28 DIAGNOSIS — F39 Unspecified mood [affective] disorder: Secondary | ICD-10-CM | POA: Diagnosis not present

## 2021-05-28 DIAGNOSIS — E785 Hyperlipidemia, unspecified: Secondary | ICD-10-CM | POA: Diagnosis not present

## 2021-05-28 DIAGNOSIS — I129 Hypertensive chronic kidney disease with stage 1 through stage 4 chronic kidney disease, or unspecified chronic kidney disease: Secondary | ICD-10-CM | POA: Diagnosis not present

## 2021-05-28 DIAGNOSIS — G43909 Migraine, unspecified, not intractable, without status migrainosus: Secondary | ICD-10-CM | POA: Diagnosis not present

## 2021-05-28 DIAGNOSIS — K219 Gastro-esophageal reflux disease without esophagitis: Secondary | ICD-10-CM | POA: Diagnosis not present

## 2021-05-28 DIAGNOSIS — N1831 Chronic kidney disease, stage 3a: Secondary | ICD-10-CM | POA: Diagnosis not present

## 2021-05-29 DIAGNOSIS — F419 Anxiety disorder, unspecified: Secondary | ICD-10-CM | POA: Diagnosis not present

## 2021-05-29 DIAGNOSIS — M160 Bilateral primary osteoarthritis of hip: Secondary | ICD-10-CM | POA: Diagnosis not present

## 2021-05-29 DIAGNOSIS — I10 Essential (primary) hypertension: Secondary | ICD-10-CM | POA: Diagnosis not present

## 2021-05-29 DIAGNOSIS — E538 Deficiency of other specified B group vitamins: Secondary | ICD-10-CM | POA: Diagnosis not present

## 2021-05-29 DIAGNOSIS — E785 Hyperlipidemia, unspecified: Secondary | ICD-10-CM | POA: Diagnosis not present

## 2021-06-10 DIAGNOSIS — H40023 Open angle with borderline findings, high risk, bilateral: Secondary | ICD-10-CM | POA: Diagnosis not present

## 2021-06-13 DIAGNOSIS — M12851 Other specific arthropathies, not elsewhere classified, right hip: Secondary | ICD-10-CM | POA: Diagnosis not present

## 2021-06-13 DIAGNOSIS — M48061 Spinal stenosis, lumbar region without neurogenic claudication: Secondary | ICD-10-CM | POA: Diagnosis not present

## 2021-06-13 DIAGNOSIS — M8588 Other specified disorders of bone density and structure, other site: Secondary | ICD-10-CM | POA: Diagnosis not present

## 2021-06-13 DIAGNOSIS — K219 Gastro-esophageal reflux disease without esophagitis: Secondary | ICD-10-CM | POA: Diagnosis not present

## 2021-06-13 DIAGNOSIS — M959 Acquired deformity of musculoskeletal system, unspecified: Secondary | ICD-10-CM | POA: Diagnosis not present

## 2021-06-13 DIAGNOSIS — M12852 Other specific arthropathies, not elsewhere classified, left hip: Secondary | ICD-10-CM | POA: Diagnosis not present

## 2021-07-30 DIAGNOSIS — J301 Allergic rhinitis due to pollen: Secondary | ICD-10-CM | POA: Diagnosis not present

## 2021-08-07 DIAGNOSIS — J301 Allergic rhinitis due to pollen: Secondary | ICD-10-CM | POA: Diagnosis not present

## 2021-09-16 DIAGNOSIS — R5383 Other fatigue: Secondary | ICD-10-CM | POA: Diagnosis not present

## 2021-09-16 DIAGNOSIS — M160 Bilateral primary osteoarthritis of hip: Secondary | ICD-10-CM | POA: Diagnosis not present

## 2021-09-16 DIAGNOSIS — I10 Essential (primary) hypertension: Secondary | ICD-10-CM | POA: Diagnosis not present

## 2021-09-16 DIAGNOSIS — E538 Deficiency of other specified B group vitamins: Secondary | ICD-10-CM | POA: Diagnosis not present

## 2021-10-09 DIAGNOSIS — G459 Transient cerebral ischemic attack, unspecified: Secondary | ICD-10-CM

## 2021-10-09 HISTORY — DX: Transient cerebral ischemic attack, unspecified: G45.9

## 2021-10-14 DIAGNOSIS — H40023 Open angle with borderline findings, high risk, bilateral: Secondary | ICD-10-CM | POA: Diagnosis not present

## 2021-10-29 ENCOUNTER — Emergency Department (HOSPITAL_COMMUNITY): Payer: Medicare Other

## 2021-10-29 ENCOUNTER — Emergency Department (HOSPITAL_COMMUNITY)
Admission: EM | Admit: 2021-10-29 | Discharge: 2021-10-29 | Disposition: A | Discharge status: Home / Self Care | Payer: Medicare Other | Attending: Emergency Medicine | Admitting: Emergency Medicine

## 2021-10-29 ENCOUNTER — Encounter (HOSPITAL_COMMUNITY): Payer: Self-pay | Admitting: *Deleted

## 2021-10-29 ENCOUNTER — Other Ambulatory Visit: Payer: Self-pay

## 2021-10-29 DIAGNOSIS — R479 Unspecified speech disturbances: Secondary | ICD-10-CM | POA: Diagnosis present

## 2021-10-29 DIAGNOSIS — H53123 Transient visual loss, bilateral: Secondary | ICD-10-CM | POA: Diagnosis not present

## 2021-10-29 DIAGNOSIS — R7309 Other abnormal glucose: Secondary | ICD-10-CM | POA: Diagnosis not present

## 2021-10-29 DIAGNOSIS — R110 Nausea: Secondary | ICD-10-CM | POA: Insufficient documentation

## 2021-10-29 DIAGNOSIS — Z8616 Personal history of COVID-19: Secondary | ICD-10-CM | POA: Insufficient documentation

## 2021-10-29 DIAGNOSIS — H538 Other visual disturbances: Secondary | ICD-10-CM | POA: Insufficient documentation

## 2021-10-29 DIAGNOSIS — R29818 Other symptoms and signs involving the nervous system: Secondary | ICD-10-CM | POA: Diagnosis not present

## 2021-10-29 DIAGNOSIS — R519 Headache, unspecified: Secondary | ICD-10-CM | POA: Diagnosis not present

## 2021-10-29 LAB — CBC WITH DIFFERENTIAL/PLATELET
Abs Immature Granulocytes: 0.01 10*3/uL (ref 0.00–0.07)
Basophils Absolute: 0.1 10*3/uL (ref 0.0–0.1)
Basophils Relative: 1 %
Eosinophils Absolute: 0.1 10*3/uL (ref 0.0–0.5)
Eosinophils Relative: 2 %
HCT: 37.5 % (ref 36.0–46.0)
Hemoglobin: 12.4 g/dL (ref 12.0–15.0)
Immature Granulocytes: 0 %
Lymphocytes Relative: 33 %
Lymphs Abs: 2 10*3/uL (ref 0.7–4.0)
MCH: 31.2 pg (ref 26.0–34.0)
MCHC: 33.1 g/dL (ref 30.0–36.0)
MCV: 94.2 fL (ref 80.0–100.0)
Monocytes Absolute: 0.6 10*3/uL (ref 0.1–1.0)
Monocytes Relative: 10 %
Neutro Abs: 3.2 10*3/uL (ref 1.7–7.7)
Neutrophils Relative %: 54 %
Platelets: 278 10*3/uL (ref 150–400)
RBC: 3.98 MIL/uL (ref 3.87–5.11)
RDW: 12.5 % (ref 11.5–15.5)
WBC: 6 10*3/uL (ref 4.0–10.5)
nRBC: 0 % (ref 0.0–0.2)

## 2021-10-29 LAB — COMPREHENSIVE METABOLIC PANEL
ALT: 26 U/L (ref 0–44)
AST: 19 U/L (ref 15–41)
Albumin: 4 g/dL (ref 3.5–5.0)
Alkaline Phosphatase: 80 U/L (ref 38–126)
Anion gap: 7 (ref 5–15)
BUN: 16 mg/dL (ref 8–23)
CO2: 27 mmol/L (ref 22–32)
Calcium: 9.3 mg/dL (ref 8.9–10.3)
Chloride: 103 mmol/L (ref 98–111)
Creatinine, Ser: 1.1 mg/dL — ABNORMAL HIGH (ref 0.44–1.00)
GFR, Estimated: 54 mL/min — ABNORMAL LOW (ref >60)
Glucose, Bld: 86 mg/dL (ref 70–99)
Potassium: 3.9 mmol/L (ref 3.5–5.1)
Sodium: 137 mmol/L (ref 135–145)
Total Bilirubin: 0.8 mg/dL (ref 0.3–1.2)
Total Protein: 6.5 g/dL (ref 6.5–8.1)

## 2021-10-29 LAB — CBG MONITORING, ED
Glucose-Capillary: 69 mg/dL — ABNORMAL LOW (ref 70–99)
Glucose-Capillary: 110 mg/dL — ABNORMAL HIGH (ref 70–99)

## 2021-10-29 MED ORDER — ACETAMINOPHEN 325 MG PO TABS
650.0000 mg | ORAL_TABLET | Freq: Once | ORAL | Status: AC
  Administered 2021-10-29: 650 mg via ORAL
  Filled 2021-10-29: qty 2

## 2021-10-29 MED ORDER — GADOBUTROL 1 MMOL/ML IV SOLN
7.0000 mL | Freq: Once | INTRAVENOUS | Status: AC | PRN
  Administered 2021-10-29: 7 mL via INTRAVENOUS

## 2021-10-29 NOTE — ED Provider Notes (Signed)
[facility] Provider Note   CSN: [id number] Arrival date & time: 10/29/21  1255     History  Chief Complaint  Patient presents with   Blurred Vision    [name] is a 71 y.o. female.  HPI 71 year old female presents with headache and blurred vision.  She has been having headaches daily ever since having COVID at the end of December.  However over the last week or so it feels like is more left-sided and occipital/parietal.  It is a constant headache but gets worse when she does activities.  She is also had some mild blurred vision through both eyes over the last week or so.  Primary concern is that she also had difficulty speaking, most recently on 2/17 when they were driving down to [place].  Husband tells me that she was trying to talk to him and his sound like her speech was garbled.  She did it twice where he could not understand a single word.  She tells me she remembers that but could not understand why she could not get the right word out.  Had another episode similar to that 8 days ago but the patient does not remember that 1.  Headache typically is responsive to Tylenol and right now her headache is about a 2 out of 10.  Chronically nauseated but no vomiting.  Has chronic neck pain but no new neck pain.  No current fevers.  No weakness or numbness.  Called her doctor who told her to come to the ER.  Home Medications Prior to Admission medications   Medication Sig Start Date End Date Taking? Authorizing Provider  AMBULATORY NON FORMULARY MEDICATION once a week. Allergy shot  2 allergy shots weekly   Yes [provider]  atorvastatin (LIPITOR) 20 MG tablet Take 20 mg by mouth daily. 09/14/19  Yes [provider]  busPIRone (BUSPAR) 7.5 MG tablet Take 7.5 mg by mouth 2 (two) times daily.   Yes [provider]  Cholecalciferol (VITAMIN D3 PO) Take 1 tablet by mouth daily.   Yes [provider]  citalopram (CELEXA) 10 MG  tablet Take 40 mg by mouth daily.   Yes [provider]  clotrimazole (LOTRIMIN) 1 % cream Apply 1 application topically daily as needed (rash). 03/06/21  Yes [provider]  EPINEPHrine 0.3 mg/0.3 mL IJ SOAJ injection Inject into the muscle. 05/31/20  Yes [provider]  irbesartan-hydrochlorothiazide (AVALIDE) 150-12.5 MG tablet Take 1 tablet by mouth daily. 01/14/21  Yes [provider]  montelukast (SINGULAIR) 10 MG tablet Take 10 mg by mouth at bedtime. 10/16/19  Yes [provider]  ondansetron (ZOFRAN-ODT) 4 MG disintegrating tablet Take 4 mg by mouth every 6 (six) hours as needed for vomiting or nausea. 10/11/19  Yes [provider]  pantoprazole (PROTONIX) 40 MG tablet Take 1 tablet (40 mg total) by mouth 2 (two) times daily. Patient taking differently: Take 40 mg by mouth daily. 12/17/20  Yes Levin Erp, PA  SUMAtriptan (IMITREX) 100 MG tablet Take 100 mg by mouth daily as needed. 08/13/16  Yes [provider]  vitamin B-12 (CYANOCOBALAMIN) 500 MCG tablet Take 500 mcg by mouth daily.   Yes [provider]  ibuprofen (ADVIL,MOTRIN) 600 MG tablet Take 1 tablet (600 mg total) by mouth every 6 (six) hours as needed for mild pain or moderate pain. Patient not taking: Reported on 10/29/2021 08/09/15   Clayton Bibles, [place]      Allergies  Patient has no known allergies.    Review of Systems   Review of Systems  Constitutional:  Negative for fever.  Eyes:  Positive for visual disturbance.  Gastrointestinal:  Positive for nausea. Negative for vomiting.  Musculoskeletal:  Positive for neck pain (chronic). Negative for neck stiffness.  Neurological:  Positive for headaches. Negative for dizziness, weakness and numbness.   Physical Exam Updated Vital Signs BP (!) 112/92 (BP Location: Right Arm)    Pulse 62    Temp 98.2 F (36.8 C) (Oral)    Resp 18    Ht 5\' 3"  (1.6 m)    Wt 76.7 kg    SpO2 99%    BMI 29.94 kg/m   Physical Exam Vitals and nursing note reviewed.  Constitutional:      General: She is not in acute distress.    Appearance: She is well-developed. She is not ill-appearing or diaphoretic.  HENT:     Head: Normocephalic and atraumatic.  Eyes:     Extraocular Movements: Extraocular movements intact.     Pupils: Pupils are equal, round, and reactive to light.     Comments: Normal visual fields  Cardiovascular:     Rate and Rhythm: Normal rate and regular rhythm.     Heart sounds: Normal heart sounds.  Pulmonary:     Effort: Pulmonary effort is normal.     Breath sounds: Normal breath sounds.  Abdominal:     Palpations: Abdomen is soft.     Tenderness: There is no abdominal tenderness.  Musculoskeletal:     Cervical back: No rigidity.  Skin:    General: Skin is warm and dry.  Neurological:     Mental Status: She is alert.     Comments: CN 3-12 grossly intact. 5/5 strength in all 4 extremities. Grossly normal sensation. Normal finger to nose.     ED Results / Procedures / Treatments   Labs (all labs ordered are listed, but only abnormal results are displayed) Labs Reviewed  COMPREHENSIVE METABOLIC PANEL - Abnormal; Notable for the following components:      Result Value   Creatinine, Ser 1.10 (*)    GFR, Estimated 54 (*)    All other components within normal limits  CBG MONITORING, ED - Abnormal; Notable for the following components:   Glucose-Capillary 69 (*)    All other components within normal limits  CBG MONITORING, ED - Abnormal; Notable for the following components:   Glucose-Capillary 110 (*)    All other components within normal limits  CBC WITH DIFFERENTIAL/PLATELET    EKG None  Radiology CT Head Wo Contrast  Result Date: 10/29/2021 CLINICAL DATA:  Headache, new or worsening (Age >= 50y) EXAM: CT HEAD WITHOUT CONTRAST TECHNIQUE: Contiguous axial images were obtained from the base of the skull through the vertex without intravenous contrast. RADIATION DOSE  REDUCTION: This exam was performed according to the departmental dose-optimization program which includes automated exposure control, adjustment of the mA and/or kV according to patient size and/or use of iterative reconstruction technique. COMPARISON:  CT head 08/09/2015 FINDINGS: Brain: No evidence of large-territorial acute infarction. No parenchymal hemorrhage. No mass lesion. No extra-axial collection. No mass effect or midline shift. No hydrocephalus. Basilar cisterns are patent. Vascular: No hyperdense vessel. Atherosclerotic calcifications are present within the cavernous internal carotid arteries. Skull: No acute fracture or focal lesion. Sinuses/Orbits: Paranasal sinuses and mastoid air cells are clear. The orbits are unremarkable. Other: None. IMPRESSION: No acute intracranial abnormality. Electronically Signed   By: Thomasena Edis  Mckinley Jewel M.D.   On: 10/29/2021 16:23   MR ANGIO HEAD WO CONTRAST  Result Date: 10/29/2021 CLINICAL DATA:  Neuro deficit, acute, stroke suspected; headache, chronic, new features or increased frequency; Vertebral artery dissection suspected; Vision loss, binocular; Dural venous sinus thrombosis suspected EXAM: MRI HEAD WITHOUT AND WITH CONTRAST MRA HEAD WITHOUT CONTRAST MRA NECK WITHOUT AND WITH CONTRAST MRV HEAD WITHOUT AND WITH CONTRAST TECHNIQUE: Multiplanar, multiecho pulse sequences of the brain and surrounding structures were obtained without and with intravenous contrast. Angiographic images of the Circle of Willis were obtained using MRA technique without intravenous contrast. Angiographic images of the neck were obtained using MRA technique without and with intravenous contrast. Carotid stenosis measurements (when applicable) are obtained utilizing NASCET criteria, using the distal internal carotid diameter as the denominator. MRV of the head was performed without and with intravenous contrast. CONTRAST:  46mL GADAVIST GADOBUTROL 1 MMOL/ML IV SOLN COMPARISON:  None.  FINDINGS: MRI HEAD Brain: There is no acute infarction or intracranial hemorrhage. There is no intracranial mass, mass effect, or edema. There is no hydrocephalus or extra-axial fluid collection. Ventricles and sulci are within normal limits in size and configuration. Minimal small foci of T2 hyperintensity in the supratentorial white matter are nonspecific but may reflect minor chronic microvascular ischemic changes or other etiologies of gliosis/demyelination. Vascular: Major vessel flow voids at the skull base are preserved. Skull and upper cervical spine: Normal marrow signal is preserved. Sinuses/Orbits: Paranasal sinuses are aerated. Orbits are unremarkable. Other: Sella is unremarkable.  Mastoid air cells are clear. MRA HEAD Intracranial internal carotid arteries are patent with atherosclerotic irregularity. Middle and anterior cerebral arteries are patent. Intracranial vertebral arteries, basilar artery, posterior cerebral arteries are patent. There is no significant stenosis or aneurysm. MRA NECK Great vessel origins are patent. Common, internal, and external carotid arteries are patent. Extracranial codominant vertebral arteries are patent. There is no hemodynamically significant stenosis or evidence of dissection. MRV HEAD Superior sagittal sinus, straight sinus, vein of Galen, and internal cerebral veins are patent. Transverse and sigmoid sinuses are patent. No evidence of venous thrombosis. IMPRESSION: No evidence of recent infarction, hemorrhage, or mass. No abnormal enhancement. No large vessel occlusion, hemodynamically significant stenosis, or evidence of dissection. No evidence of dural sinus thrombosis. Electronically Signed   By: Macy Mis M.D.   On: 10/29/2021 18:35   MR Angiogram Neck W or Wo Contrast  Result Date: 10/29/2021 CLINICAL DATA:  Neuro deficit, acute, stroke suspected; headache, chronic, new features or increased frequency; Vertebral artery dissection suspected; Vision  loss, binocular; Dural venous sinus thrombosis suspected EXAM: MRI HEAD WITHOUT AND WITH CONTRAST MRA HEAD WITHOUT CONTRAST MRA NECK WITHOUT AND WITH CONTRAST MRV HEAD WITHOUT AND WITH CONTRAST TECHNIQUE: Multiplanar, multiecho pulse sequences of the brain and surrounding structures were obtained without and with intravenous contrast. Angiographic images of the Circle of Willis were obtained using MRA technique without intravenous contrast. Angiographic images of the neck were obtained using MRA technique without and with intravenous contrast. Carotid stenosis measurements (when applicable) are obtained utilizing NASCET criteria, using the distal internal carotid diameter as the denominator. MRV of the head was performed without and with intravenous contrast. CONTRAST:  27mL GADAVIST GADOBUTROL 1 MMOL/ML IV SOLN COMPARISON:  None. FINDINGS: MRI HEAD Brain: There is no acute infarction or intracranial hemorrhage. There is no intracranial mass, mass effect, or edema. There is no hydrocephalus or extra-axial fluid collection. Ventricles and sulci are within normal limits in size and configuration. Minimal small foci of T2 hyperintensity  in the supratentorial white matter are nonspecific but may reflect minor chronic microvascular ischemic changes or other etiologies of gliosis/demyelination. Vascular: Major vessel flow voids at the skull base are preserved. Skull and upper cervical spine: Normal marrow signal is preserved. Sinuses/Orbits: Paranasal sinuses are aerated. Orbits are unremarkable. Other: Sella is unremarkable.  Mastoid air cells are clear. MRA HEAD Intracranial internal carotid arteries are patent with atherosclerotic irregularity. Middle and anterior cerebral arteries are patent. Intracranial vertebral arteries, basilar artery, posterior cerebral arteries are patent. There is no significant stenosis or aneurysm. MRA NECK Great vessel origins are patent. Common, internal, and external carotid arteries are  patent. Extracranial codominant vertebral arteries are patent. There is no hemodynamically significant stenosis or evidence of dissection. MRV HEAD Superior sagittal sinus, straight sinus, vein of Galen, and internal cerebral veins are patent. Transverse and sigmoid sinuses are patent. No evidence of venous thrombosis. IMPRESSION: No evidence of recent infarction, hemorrhage, or mass. No abnormal enhancement. No large vessel occlusion, hemodynamically significant stenosis, or evidence of dissection. No evidence of dural sinus thrombosis. Electronically Signed   By: Macy Mis M.D.   On: 10/29/2021 18:35   MR Brain W and Wo Contrast  Result Date: 10/29/2021 CLINICAL DATA:  Neuro deficit, acute, stroke suspected; headache, chronic, new features or increased frequency; Vertebral artery dissection suspected; Vision loss, binocular; Dural venous sinus thrombosis suspected EXAM: MRI HEAD WITHOUT AND WITH CONTRAST MRA HEAD WITHOUT CONTRAST MRA NECK WITHOUT AND WITH CONTRAST MRV HEAD WITHOUT AND WITH CONTRAST TECHNIQUE: Multiplanar, multiecho pulse sequences of the brain and surrounding structures were obtained without and with intravenous contrast. Angiographic images of the Circle of Willis were obtained using MRA technique without intravenous contrast. Angiographic images of the neck were obtained using MRA technique without and with intravenous contrast. Carotid stenosis measurements (when applicable) are obtained utilizing NASCET criteria, using the distal internal carotid diameter as the denominator. MRV of the head was performed without and with intravenous contrast. CONTRAST:  57mL GADAVIST GADOBUTROL 1 MMOL/ML IV SOLN COMPARISON:  None. FINDINGS: MRI HEAD Brain: There is no acute infarction or intracranial hemorrhage. There is no intracranial mass, mass effect, or edema. There is no hydrocephalus or extra-axial fluid collection. Ventricles and sulci are within normal limits in size and configuration. Minimal  small foci of T2 hyperintensity in the supratentorial white matter are nonspecific but may reflect minor chronic microvascular ischemic changes or other etiologies of gliosis/demyelination. Vascular: Major vessel flow voids at the skull base are preserved. Skull and upper cervical spine: Normal marrow signal is preserved. Sinuses/Orbits: Paranasal sinuses are aerated. Orbits are unremarkable. Other: Sella is unremarkable.  Mastoid air cells are clear. MRA HEAD Intracranial internal carotid arteries are patent with atherosclerotic irregularity. Middle and anterior cerebral arteries are patent. Intracranial vertebral arteries, basilar artery, posterior cerebral arteries are patent. There is no significant stenosis or aneurysm. MRA NECK Great vessel origins are patent. Common, internal, and external carotid arteries are patent. Extracranial codominant vertebral arteries are patent. There is no hemodynamically significant stenosis or evidence of dissection. MRV HEAD Superior sagittal sinus, straight sinus, vein of Galen, and internal cerebral veins are patent. Transverse and sigmoid sinuses are patent. No evidence of venous thrombosis. IMPRESSION: No evidence of recent infarction, hemorrhage, or mass. No abnormal enhancement. No large vessel occlusion, hemodynamically significant stenosis, or evidence of dissection. No evidence of dural sinus thrombosis. Electronically Signed   By: Macy Mis M.D.   On: 10/29/2021 18:35   MR MRV HEAD W WO CONTRAST  Result  Date: 10/29/2021 CLINICAL DATA:  Neuro deficit, acute, stroke suspected; headache, chronic, new features or increased frequency; Vertebral artery dissection suspected; Vision loss, binocular; Dural venous sinus thrombosis suspected EXAM: MRI HEAD WITHOUT AND WITH CONTRAST MRA HEAD WITHOUT CONTRAST MRA NECK WITHOUT AND WITH CONTRAST MRV HEAD WITHOUT AND WITH CONTRAST TECHNIQUE: Multiplanar, multiecho pulse sequences of the brain and surrounding structures were  obtained without and with intravenous contrast. Angiographic images of the Circle of Willis were obtained using MRA technique without intravenous contrast. Angiographic images of the neck were obtained using MRA technique without and with intravenous contrast. Carotid stenosis measurements (when applicable) are obtained utilizing NASCET criteria, using the distal internal carotid diameter as the denominator. MRV of the head was performed without and with intravenous contrast. CONTRAST:  72mL GADAVIST GADOBUTROL 1 MMOL/ML IV SOLN COMPARISON:  None. FINDINGS: MRI HEAD Brain: There is no acute infarction or intracranial hemorrhage. There is no intracranial mass, mass effect, or edema. There is no hydrocephalus or extra-axial fluid collection. Ventricles and sulci are within normal limits in size and configuration. Minimal small foci of T2 hyperintensity in the supratentorial white matter are nonspecific but may reflect minor chronic microvascular ischemic changes or other etiologies of gliosis/demyelination. Vascular: Major vessel flow voids at the skull base are preserved. Skull and upper cervical spine: Normal marrow signal is preserved. Sinuses/Orbits: Paranasal sinuses are aerated. Orbits are unremarkable. Other: Sella is unremarkable.  Mastoid air cells are clear. MRA HEAD Intracranial internal carotid arteries are patent with atherosclerotic irregularity. Middle and anterior cerebral arteries are patent. Intracranial vertebral arteries, basilar artery, posterior cerebral arteries are patent. There is no significant stenosis or aneurysm. MRA NECK Great vessel origins are patent. Common, internal, and external carotid arteries are patent. Extracranial codominant vertebral arteries are patent. There is no hemodynamically significant stenosis or evidence of dissection. MRV HEAD Superior sagittal sinus, straight sinus, vein of Galen, and internal cerebral veins are patent. Transverse and sigmoid sinuses are patent. No  evidence of venous thrombosis. IMPRESSION: No evidence of recent infarction, hemorrhage, or mass. No abnormal enhancement. No large vessel occlusion, hemodynamically significant stenosis, or evidence of dissection. No evidence of dural sinus thrombosis. Electronically Signed   By: Macy Mis M.D.   On: 10/29/2021 18:35    Procedures Procedures    Medications Ordered in ED Medications  acetaminophen (TYLENOL) tablet 650 mg (650 mg Oral Given 10/29/21 1558)  gadobutrol (GADAVIST) 1 MMOL/ML injection 7 mL (7 mLs Intravenous Contrast Given 10/29/21 1724)    ED Course/ Medical Decision Making/ A&P                           Medical Decision Making Amount and/or Complexity of Data Reviewed Labs: ordered. Radiology: ordered.  Risk OTC drugs. Prescription drug management.   Patient presents with headache and some vague visual complaints.  Exam is unremarkable here including visual fields.  CT head was obtained and I personally reviewed these images and there is no obvious edema or mass or bleeding.  Labs are unremarkable besides a point-of-care glucose that is slightly low at 69.  This came up with eating.  I discussed the case with neurology on-call, Dr. Leonel Ramsay.  Given recent COVID as well as her other symptoms, he recommends MRI brain with and without, MRV with and without, MRA head without and MRA neck with and without.  If these are negative could follow-up as an outpatient with neuro.  All of these images are unremarkable.  Low suspicion for infection.  Thus at this point she appears stable for discharge to follow-up with outpatient neurology, she has seen [facility] neuro in the past.  Will discharge home with return precautions.        Final Clinical Impression(s) / ED Diagnoses Final diagnoses:  Left-sided headache    Rx / DC Orders ED Discharge Orders          Ordered    Ambulatory referral to Neurology       Comments: An appointment is requested in approximately: 2  weeks   10/29/21 1911              Sherwood Gambler, MD 10/29/21 [phone]

## 2021-10-29 NOTE — ED Triage Notes (Signed)
States she had several episodes of blurred vision, difficulty talking onset 4 days ago

## 2021-10-29 NOTE — ED Notes (Signed)
Pt reports mild vision changes with headache, that things that she can usually see clearly are a bit blurry past week

## 2021-10-29 NOTE — Discharge Instructions (Signed)
If you develop continued, recurrent, or worsening headache, fever, neck stiffness, vomiting, blurry or double vision, weakness or numbness in your arms or legs, trouble speaking, or any other new/concerning symptoms then return to the ER for evaluation.  

## 2021-10-30 ENCOUNTER — Telehealth: Payer: Self-pay | Admitting: Neurology

## 2021-10-30 NOTE — Telephone Encounter (Signed)
I saw her a couple of times over 5 y ago. Okay with switch.

## 2021-10-30 NOTE — Telephone Encounter (Signed)
I like to concentrate on sleep. Headache providers may be more appropriate.

## 2021-10-30 NOTE — Telephone Encounter (Signed)
Patient called in to schedule appointment to f/u after ED visit 10/29/21. Dx'd with left sided headache after complete workup for headaches/blurry vision/garbled speech. Imaging was unremarkable, ED recommended she follow up with [place]. Seen Dr. Rexene Alberts previously in 2018 for sleep disturbances. And it looks like she saw Dr. Trula Ore at [facility] in 2021 for various reasons as well although I cannot see those notes. Patient is requesting to switch to Dr. Brett Fairy as her husband sees her and would love to be able to see Dr. Brett Fairy as well. Please advise if this is appropriate. Thank you

## 2021-10-30 NOTE — Telephone Encounter (Signed)
I can see her, thanks

## 2021-11-12 DIAGNOSIS — Z20822 Contact with and (suspected) exposure to covid-19: Secondary | ICD-10-CM | POA: Diagnosis not present

## 2021-12-02 ENCOUNTER — Encounter: Payer: Self-pay | Admitting: Psychiatry

## 2021-12-02 ENCOUNTER — Ambulatory Visit (INDEPENDENT_AMBULATORY_CARE_PROVIDER_SITE_OTHER): Payer: Medicare Other | Admitting: Psychiatry

## 2021-12-02 ENCOUNTER — Other Ambulatory Visit: Payer: Self-pay

## 2021-12-02 VITALS — BP 116/76 | HR 68 | Ht 64.0 in | Wt 171.4 lb

## 2021-12-02 DIAGNOSIS — E0965 Drug or chemical induced diabetes mellitus with hyperglycemia: Secondary | ICD-10-CM | POA: Diagnosis not present

## 2021-12-02 DIAGNOSIS — G459 Transient cerebral ischemic attack, unspecified: Secondary | ICD-10-CM | POA: Diagnosis not present

## 2021-12-02 DIAGNOSIS — G43109 Migraine with aura, not intractable, without status migrainosus: Secondary | ICD-10-CM

## 2021-12-02 NOTE — Progress Notes (Signed)
? ?Referring:  ?Sherwood Gambler, MD ?[place] ?[place],  [place] [address] ? ?PCP: ?Prince Solian, MD ? ?Neurology was asked to evaluate [name], a 71 year old female for a chief complaint of headaches.  Our recommendations of care will be communicated by shared medical record.   ? ?CC:  headaches ? ?History provided from self ? ?HPI:  ?Medical co-morbidities: B12 deficiency, HTN, CKD, HLD, spinal stenosis ? ?The patient presents for evaluation of headaches and speech changes. She developed daily headaches following COVID infection in December 2022. Headache was a constant dull pain, without associated photophobia, phonophobia, or nausea.  ? ?In late Nov 02, 2020 she had an episode of garbled speech which lasted for one minute. She was travelling, stopped to use the bathroom, then felt spacey and "different". She tried to speak to her husband, but it came out as gibberish. She understood what people were saying to her, but could not get words out. She presented to the ED 10/29/21 where MRI/MRA/MRV were unremarkable. She does not remember if she had a headache at the time, but per ED visit she had reported headaches and blurred vision. ? ?She notes that she had been taking care of her mother, which caused a lot of stress at home at the time. Her mother passed away at the end of 11/02/2022, and her headaches resolved soon after this. ? ?She does have a history of migraines with visual aura. They have not previously been associated with numbness, weakness, or language changes. Takes Imitrex as needed which does help. ? ?Headache days per month: 1 ?Headache free days per month: 29 ? ?Prior Therapies                                 ?Imitrex 100 mg PRN ?Irbesartan ?Celexa 40 mg daily ?Gabapentin 300 mg TID - drowsiness ? ?LABS: ?04/08/21: ESR 22, CRP <4 ? ? ?  Latest Ref Rng & Units 10/29/2021  ?  3:50 PM 01/18/2021  ?  1:05 PM 02/18/2017  ?  6:56 PM  ?CMP  ?Glucose 70 - 99 mg/dL 86      ?BUN 8 - 23 mg/dL 16       ?Creatinine 0.44 - 1.00 mg/dL 1.10    1.00    ?Sodium 135 - 145 mmol/L 137      ?Potassium 3.5 - 5.1 mmol/L 3.9      ?Chloride 98 - 111 mmol/L 103      ?CO2 22 - 32 mmol/L 27      ?Calcium 8.9 - 10.3 mg/dL 9.3      ?Total Protein 6.5 - 8.1 g/dL 6.5   6.5     ?Total Bilirubin 0.3 - 1.2 mg/dL 0.8   0.7     ?Alkaline Phos 38 - 126 U/L 80   73     ?AST 15 - 41 U/L 19   18     ?ALT 0 - 44 U/L 26   21     ? ? ? ?IMAGING:  ?MRI brain, MRV brain, MRA head/neck 10/09/21: no acute process ? ?Imaging independently reviewed on December 02, 2021  ? ?Current Outpatient Medications on File Prior to Visit  ?Medication Sig Dispense Refill  ? acetaminophen (TYLENOL) 500 MG tablet Take 500 mg by mouth every 6 (six) hours as needed.    ? AMBULATORY NON FORMULARY MEDICATION once a week. Allergy shot  2 allergy shots weekly    ?  atorvastatin (LIPITOR) 20 MG tablet Take 20 mg by mouth daily.    ? busPIRone (BUSPAR) 7.5 MG tablet Take 7.5 mg by mouth 2 (two) times daily.    ? Cholecalciferol (VITAMIN D3 PO) Take 1 tablet by mouth daily.    ? citalopram (CELEXA) 10 MG tablet Take 40 mg by mouth daily.    ? clotrimazole (LOTRIMIN) 1 % cream Apply 1 application topically daily as needed (rash).    ? EPINEPHrine 0.3 mg/0.3 mL IJ SOAJ injection Inject into the muscle.    ? irbesartan-hydrochlorothiazide (AVALIDE) 150-12.5 MG tablet Take 1 tablet by mouth daily.    ? meloxicam (MOBIC) 7.5 MG tablet Take 7.5 mg by mouth daily.    ? montelukast (SINGULAIR) 10 MG tablet Take 10 mg by mouth at bedtime.    ? ondansetron (ZOFRAN-ODT) 4 MG disintegrating tablet Take 4 mg by mouth every 6 (six) hours as needed for vomiting or nausea.    ? pantoprazole (PROTONIX) 40 MG tablet Take 1 tablet (40 mg total) by mouth 2 (two) times daily. (Patient taking differently: Take 40 mg by mouth daily.) 60 tablet 5  ? SUMAtriptan (IMITREX) 100 MG tablet Take 100 mg by mouth daily as needed.  4  ? vitamin B-12 (CYANOCOBALAMIN) 500 MCG tablet Take 500 mcg by mouth daily.     ? ?No current facility-administered medications on file prior to visit.  ? ? ? ?Allergies: ?No Known Allergies ? ?Family History: ?Migraine or other headaches in the family:  no ?Aneurysms in a first degree relative:  mother had aortic aneurysm ?Brain tumors in the family:  none ?Other neurological illness in the family:   none ? ?Past Medical History: ?Past Medical History:  ?Diagnosis Date  ? Allergic rhinitis   ? Allergy   ? Anxiety   ? Cancer [facility])   ? skin cancer corner of eye   ? Depression   ? GERD (gastroesophageal reflux disease)   ? H/O colonoscopy   ? 5 14 09 Dr Oletta Lamas  ? Hiatal hernia   ? Hyperlipidemia   ? Hypertension   ? IBS (irritable bowel syndrome)   ? Internal hemorrhoids   ? Irregular heart beats   ? Migraine   ? Neuromuscular disorder ([place])   ? Hiatal hernia   ? Spinal stenosis   ? ? ?Past Surgical History ?Past Surgical History:  ?Procedure Laterality Date  ? 53 HOUR [place] STUDY N/A 03/16/2017  ? Procedure: [place] STUDY;  Surgeon: Jerene Bears, MD;  Location: [facility];  Service: Gastroenterology;  Laterality: N/A;  ? COLONOSCOPY    ? ESOPHAGEAL MANOMETRY N/A 03/16/2017  ? Procedure: ESOPHAGEAL MANOMETRY (EM);  Surgeon: Jerene Bears, MD;  Location: [facility];  Service: Gastroenterology;  Laterality: N/A;  ? TONSILLECTOMY    ? VAGINAL HYSTERECTOMY    ? WISDOM TOOTH EXTRACTION    ? ? ?Social History: ?Social History  ? ?Tobacco Use  ? Smoking status: Never  ? Smokeless tobacco: Never  ?Vaping Use  ? Vaping Use: Never used  ?Substance Use Topics  ? Alcohol use: Yes  ?  Comment: very rare  ? Drug use: No  ? ? ?ROS: ?Negative for fevers, chills. Positive for headaches, speech changes. All other systems reviewed and negative unless stated otherwise in HPI. ? ? ?Physical Exam:  ? ?Vital Signs: ?BP 116/76   Pulse 68   Ht _0  (1.626 m)   Wt 171 lb 6.4 oz (77.7 kg)   BMI 29.42 kg/m?  ?  GENERAL: well appearing,in no acute distress,alert ?SKIN:  Color, texture, turgor normal. No rashes or  lesions ?HEAD:  Normocephalic/atraumatic. ?CV:  RRR ?RESP: Normal respiratory effort ?MSK: no tenderness to palpation over occiput, neck, or shoulders ? ?NEUROLOGICAL: ?Mental Status: Alert, oriented to person, place and time,Follows commands ?Cranial Nerves: PERRL, visual fields intact to confrontation, extraocular movements intact, facial sensation intact, no facial droop or ptosis, hearing grossly intact, no dysarthria ?Motor: muscle strength 5/5 both upper and lower extremities ?Reflexes: 2+ throughout ?Sensation: intact to light touch all 4 extremities ?Coordination: Finger-to- nose-finger intact bilaterally ?Gait: normal-based ? ? ?IMPRESSION: ?71 year old female with a history of B12 deficiency, migraines, HTN, CKD, HLD, spinal stenosis who presents for evaluation of headaches and garbled speech. Daily headaches have resolved with decreased stress levels. It is difficult to say for certain what caused her episode of garbled speech. It may represent a language aura, as she did have a headache and vision changes that day per ED note. TIAs can also affect speech. MRI, MRV, and MRA were unremarkable. Will complete TIA workup to assess for underlying vascular risk factors. Counseled that she avoid taking sumatriptan while undergoing this workup. ? ?PLAN: ?-Blood work: check HgbA1c today. PCP has ordered lipid panel which will be drawn later this week ?-TTE ?-Avoid taking Imitrex until TIA workup is complete ? ?I spent a total of 40 minutes chart reviewing and counseling the patient. Headache education was done. Discussed medication side effects, adverse reactions and drug interactions. Written educational materials and patient instructions outlining all of the above were given. ? ?Follow-up: 4 months ? ? ?Genia Harold, MD ?12/02/2021   ?4:32 PM ? ? ?

## 2021-12-02 NOTE — Patient Instructions (Signed)
Please avoid taking sumatriptan for migraines while undergoing TIA workup ?Ultrasound of the heart ?Blood work to check blood sugar ?Please send blood work from family doctor to our office once complete ?

## 2021-12-03 ENCOUNTER — Telehealth: Payer: Self-pay

## 2021-12-03 LAB — HEMOGLOBIN A1C
Est. average glucose Bld gHb Est-mCnc: 114 mg/dL
Hgb A1c MFr Bld: 5.6 % (ref 4.8–5.6)

## 2021-12-03 NOTE — Telephone Encounter (Signed)
-----   Message from Genia Harold, MD sent at 12/03/2021  8:17 AM EDT ----- ?Blood work is normal  ?

## 2021-12-03 NOTE — Telephone Encounter (Signed)
Contacted pt, LVM per DPR, informing her labs results are normal. Advised to call back with questions  ?

## 2021-12-05 DIAGNOSIS — E785 Hyperlipidemia, unspecified: Secondary | ICD-10-CM | POA: Diagnosis not present

## 2021-12-05 DIAGNOSIS — J301 Allergic rhinitis due to pollen: Secondary | ICD-10-CM | POA: Diagnosis not present

## 2021-12-05 DIAGNOSIS — N1831 Chronic kidney disease, stage 3a: Secondary | ICD-10-CM | POA: Diagnosis not present

## 2021-12-11 DIAGNOSIS — G459 Transient cerebral ischemic attack, unspecified: Secondary | ICD-10-CM | POA: Diagnosis not present

## 2021-12-11 DIAGNOSIS — M8580 Other specified disorders of bone density and structure, unspecified site: Secondary | ICD-10-CM | POA: Diagnosis not present

## 2021-12-11 DIAGNOSIS — R059 Cough, unspecified: Secondary | ICD-10-CM | POA: Diagnosis not present

## 2021-12-11 DIAGNOSIS — I129 Hypertensive chronic kidney disease with stage 1 through stage 4 chronic kidney disease, or unspecified chronic kidney disease: Secondary | ICD-10-CM | POA: Diagnosis not present

## 2021-12-11 DIAGNOSIS — E785 Hyperlipidemia, unspecified: Secondary | ICD-10-CM | POA: Diagnosis not present

## 2021-12-11 DIAGNOSIS — R5383 Other fatigue: Secondary | ICD-10-CM | POA: Diagnosis not present

## 2021-12-11 DIAGNOSIS — E538 Deficiency of other specified B group vitamins: Secondary | ICD-10-CM | POA: Diagnosis not present

## 2021-12-11 DIAGNOSIS — N1831 Chronic kidney disease, stage 3a: Secondary | ICD-10-CM | POA: Diagnosis not present

## 2021-12-11 DIAGNOSIS — Z0000 Encounter for general adult medical examination without abnormal findings: Secondary | ICD-10-CM | POA: Diagnosis not present

## 2021-12-11 DIAGNOSIS — Z79899 Other long term (current) drug therapy: Secondary | ICD-10-CM | POA: Diagnosis not present

## 2021-12-11 DIAGNOSIS — M4800 Spinal stenosis, site unspecified: Secondary | ICD-10-CM | POA: Diagnosis not present

## 2021-12-26 DIAGNOSIS — J301 Allergic rhinitis due to pollen: Secondary | ICD-10-CM | POA: Diagnosis not present

## 2021-12-30 ENCOUNTER — Telehealth: Payer: Self-pay | Admitting: Psychiatry

## 2021-12-30 NOTE — Telephone Encounter (Signed)
Medicare/arrp no Roxanna Mew, sent Butch Penny a message she will reach out to the patient to schedule.  ?

## 2022-01-05 DIAGNOSIS — Z20822 Contact with and (suspected) exposure to covid-19: Secondary | ICD-10-CM | POA: Diagnosis not present

## 2022-01-15 DIAGNOSIS — Z20822 Contact with and (suspected) exposure to covid-19: Secondary | ICD-10-CM | POA: Diagnosis not present

## 2022-02-12 ENCOUNTER — Ambulatory Visit (HOSPITAL_COMMUNITY)
Admission: RE | Admit: 2022-02-12 | Discharge: 2022-02-12 | Disposition: A | Discharge status: Home / Self Care | Payer: Medicare Other | Source: Ambulatory Visit | Attending: Psychiatry | Admitting: Psychiatry

## 2022-02-12 ENCOUNTER — Other Ambulatory Visit: Payer: Self-pay | Admitting: Psychiatry

## 2022-02-12 ENCOUNTER — Telehealth: Payer: Self-pay | Admitting: *Deleted

## 2022-02-12 DIAGNOSIS — I10 Essential (primary) hypertension: Secondary | ICD-10-CM | POA: Insufficient documentation

## 2022-02-12 DIAGNOSIS — G459 Transient cerebral ischemic attack, unspecified: Secondary | ICD-10-CM | POA: Insufficient documentation

## 2022-02-12 DIAGNOSIS — I253 Aneurysm of heart: Secondary | ICD-10-CM

## 2022-02-12 HISTORY — PX: TRANSTHORACIC ECHOCARDIOGRAM: SHX275

## 2022-02-12 LAB — ECHOCARDIOGRAM COMPLETE BUBBLE STUDY
AR max vel: 2.01 cm2
AV Area VTI: 2.01 cm2
AV Area mean vel: 2.02 cm2
AV Mean grad: 6 mmHg
AV Peak grad: 10.5 mmHg
Ao pk vel: 1.62 m/s
Area-P 1/2: 2.26 cm2
S' Lateral: 2.7 cm

## 2022-02-12 NOTE — Telephone Encounter (Signed)
Called patient and informed her per Dr Billey Gosling, the TTE shows an "atrial septal aneurysm with associated patent foramen ovale", which is essentially a shunt in the heart that she was born with. This can increase risk of stroke and TIAs. Dr Billey Gosling recommends she start aspirin 81 mg daily for stroke prevention. She should avoid taking sumatriptan as this can also increase risk of stroke/TIA. Dr Billey Gosling placed a referral to Cardiology to see if this needs any further imaging or monitoring over time.  Answered her questions to her stated satisfaction. Patient verbalized understanding, appreciation.

## 2022-02-13 ENCOUNTER — Telehealth: Payer: Self-pay | Admitting: Psychiatry

## 2022-02-13 DIAGNOSIS — H40023 Open angle with borderline findings, high risk, bilateral: Secondary | ICD-10-CM | POA: Diagnosis not present

## 2022-02-13 NOTE — Telephone Encounter (Signed)
Referral for Cardiology sent to [place] at [place].

## 2022-02-21 ENCOUNTER — Ambulatory Visit (INDEPENDENT_AMBULATORY_CARE_PROVIDER_SITE_OTHER): Payer: Medicare Other | Admitting: Cardiology

## 2022-02-21 ENCOUNTER — Encounter: Payer: Self-pay | Admitting: Cardiology

## 2022-02-21 VITALS — BP 117/60 | HR 61 | Ht 64.0 in | Wt 172.0 lb

## 2022-02-21 DIAGNOSIS — G43709 Chronic migraine without aura, not intractable, without status migrainosus: Secondary | ICD-10-CM | POA: Diagnosis not present

## 2022-02-21 DIAGNOSIS — I253 Aneurysm of heart: Secondary | ICD-10-CM | POA: Diagnosis not present

## 2022-02-21 DIAGNOSIS — I10 Essential (primary) hypertension: Secondary | ICD-10-CM | POA: Diagnosis not present

## 2022-02-21 DIAGNOSIS — Q2112 Patent foramen ovale: Secondary | ICD-10-CM

## 2022-02-21 DIAGNOSIS — G459 Transient cerebral ischemic attack, unspecified: Secondary | ICD-10-CM | POA: Diagnosis not present

## 2022-02-21 HISTORY — DX: Patent foramen ovale: Q21.12

## 2022-02-21 NOTE — Progress Notes (Unsigned)
Primary Care Provider: Prince Solian, MD [facility] Cardiologist: None Electrophysiologist: None  Clinic Note: No chief complaint on file.   ===================================  ASSESSMENT/PLAN   Problem List Items Addressed This Visit       Cardiology Problems   PFO with atrial septal aneurysm (Chronic)   Relevant Medications   aspirin 81 MG chewable tablet   Migraine, chronic, without aura (Chronic)   Relevant Medications   aspirin 81 MG chewable tablet   meloxicam (MOBIC) 7.5 MG tablet   TIA (transient ischemic attack)   Relevant Medications   aspirin 81 MG chewable tablet   Essential hypertension, benign - Primary (Chronic)   Relevant Medications   aspirin 81 MG chewable tablet    ===================================  HPI:    [name] is a 71 y.o. female who is being seen today for the evaluation of *** at the request of Avva, Ravisankar, MD.  [name] was last seen on ***  Recent Hospitalizations: ***  Reviewed  CV studies:    The following studies were reviewed today: (if available, images/films reviewed: From Epic Chart or Care Everywhere) TTE 02/12/2022: (For TIA) EF 60 to 65%.  No RWMA.  GR 1 DD.  Normal RV function with normal PAP.  IAS shows evidence of atrial septal "aneurysm" with PFO and intermittent large right to left shunt.  Positive bubble study suggestive of intra-atrial shunt..  Normal valves.  -> Neurology recommended aspirin.  MRI/A Head/Brain 10/29/2021 :  No evidence of recent infarction, hemorrhage, or mass. No abnormal enhancement. No large vessel occlusion, hemodynamically significant stenosis, or evidence of dissection. No evidence of dural sinus thrombosis.  Interval History:   [name]   CV Review of Symptoms (Summary) Cardiovascular ROS: {roscv:310661}  REVIEWED OF SYSTEMS   ROS  I have reviewed and (if needed) personally updated the patient's problem list, medications, allergies, past  medical and surgical history, social and family history.   PAST MEDICAL HISTORY   Past Medical History:  Diagnosis Date   Allergic rhinitis    Allergy    Anxiety    Cancer ([place])    skin cancer corner of eye    Depression    GERD (gastroesophageal reflux disease)    H/O colonoscopy    5 14 09 Dr Oletta Lamas   Hiatal hernia    Hyperlipidemia    Hypertension    IBS (irritable bowel syndrome)    Internal hemorrhoids    Irregular heart beats    Migraine    Neuromuscular disorder ([place])    Hiatal hernia    Spinal stenosis     PAST SURGICAL HISTORY   Past Surgical History:  Procedure Laterality Date   6 HOUR [place] STUDY N/A 03/16/2017   Procedure: 24 HOUR [place] STUDY;  Surgeon: Jerene Bears, MD;  Location: [facility];  Service: Gastroenterology;  Laterality: N/A;   COLONOSCOPY     ESOPHAGEAL MANOMETRY N/A 03/16/2017   Procedure: ESOPHAGEAL MANOMETRY (EM);  Surgeon: Jerene Bears, MD;  Location: [facility];  Service: Gastroenterology;  Laterality: N/A;   TONSILLECTOMY     VAGINAL HYSTERECTOMY     WISDOM TOOTH EXTRACTION       There is no immunization history on file for this patient.  MEDICATIONS/ALLERGIES   Current Meds  Medication Sig   acetaminophen (TYLENOL) 500 MG tablet Take 500 mg by mouth every 6 (six) hours as needed.   aspirin 81 MG chewable tablet Chew 81 mg by mouth daily.   atorvastatin (  LIPITOR) 20 MG tablet Take 20 mg by mouth daily.   busPIRone (BUSPAR) 7.5 MG tablet Take 7.5 mg by mouth 2 (two) times daily.   Cholecalciferol (VITAMIN D3 PO) Take 1 tablet by mouth daily.   citalopram (CELEXA) 10 MG tablet Take 40 mg by mouth daily.   irbesartan-hydrochlorothiazide (AVALIDE) 150-12.5 MG tablet Take 1 tablet by mouth daily.   meloxicam (MOBIC) 7.5 MG tablet Take 7.5 mg by mouth daily.   meloxicam (MOBIC) 7.5 MG tablet 1 tablet Orally Once a day   montelukast (SINGULAIR) 10 MG tablet Take 10 mg by mouth at bedtime.   pantoprazole (PROTONIX) 40 MG tablet Take 1  tablet (40 mg total) by mouth 2 (two) times daily. (Patient taking differently: Take 40 mg by mouth daily.)   vitamin B-12 (CYANOCOBALAMIN) 500 MCG tablet Take 500 mcg by mouth daily.    No Known Allergies  SOCIAL HISTORY/FAMILY HISTORY   Reviewed in Epic:   Social History   Tobacco Use   Smoking status: Never   Smokeless tobacco: Never  Vaping Use   Vaping Use: Never used  Substance Use Topics   Alcohol use: Yes    Comment: very rare   Drug use: No   Social History   Social History Narrative   Lives with husband   Caffeine- coffee 1 c, 2 glasses of tea   Family History  Problem Relation Age of Onset   Uterine cancer Mother    Colon polyps Mother    Heart disease Mother    Irritable bowel syndrome Mother    Cancer Mother        uterine   Hypertension Mother    COPD Mother    Emphysema Father    Hypertension Father    Alcohol abuse Father    COPD Brother    Diabetes Brother    Sleep apnea Brother    Irritable bowel syndrome Maternal Aunt    Irritable bowel syndrome Cousin        x 4   Cancer Cousin        breast   Cancer Paternal Grandmother        breast   Colon cancer Neg Hx    Esophageal cancer Neg Hx    Rectal cancer Neg Hx    Stomach cancer Neg Hx     OBJCTIVE -PE, EKG, labs   Wt Readings from Last 3 Encounters:  02/21/22 172 lb (78 kg)  12/02/21 171 lb 6.4 oz (77.7 kg)  10/29/21 169 lb (76.7 kg)    Physical Exam: BP 117/60   Pulse 61   Ht '5\' 4"'$  (1.626 m)   Wt 172 lb (78 kg)   SpO2 97%   BMI 29.52 kg/m  Physical Exam   Adult ECG Report  Rate: *** ;  Rhythm: {rhythm:17366};   Narrative Interpretation: ***  Recent Labs:  reviewed  No results found for: "CHOL", "HDL", "LDLCALC", "LDLDIRECT", "TRIG", "CHOLHDL" Lab Results  Component Value Date   CREATININE 1.10 (H) 10/29/2021   BUN 16 10/29/2021   NA 137 10/29/2021   K 3.9 10/29/2021   CL 103 10/29/2021   CO2 27 10/29/2021      Latest Ref Rng & Units 10/29/2021    3:50 PM  11/04/2012    9:23 AM 09/14/2012   12:30 AM  CBC  WBC 4.0 - 10.5 K/uL 6.0  6.2  8.4   Hemoglobin 12.0 - 15.0 g/dL 12.4  12.8  14.3   Hematocrit 36.0 - 46.0 %  37.5  37.7  41.6   Platelets 150 - 400 K/uL 278  309.0  336     Lab Results  Component Value Date   HGBA1C 5.6 12/02/2021   Lab Results  Component Value Date   TSH 1.41 11/04/2012    ==================================================  COVID-19 Education: The signs and symptoms of COVID-19 were discussed with the patient and how to seek care for testing (follow up with PCP or arrange E-visit).    I spent a total of *** minutes with the patient spent in direct patient consultation.  Additional time spent with chart review  / charting (studies, outside notes, etc): *** min Total Time: *** min  Current medicines are reviewed at length with the patient today.  (+/- concerns) ***  This visit occurred during the SARS-CoV-2 public health emergency.  Safety protocols were in place, including screening questions prior to the visit, additional usage of staff PPE, and extensive cleaning of exam room while observing appropriate contact time as indicated for disinfecting solutions.  Notice: This dictation was prepared with Dragon dictation along with smart phrase technology. Any transcriptional errors that result from this process are unintentional and may not be corrected upon review.   Studies Ordered:  No orders of the defined types were placed in this encounter.  No orders of the defined types were placed in this encounter.   Patient Instructions / Medication Changes & Studies & Tests Ordered   There are no Patient Instructions on file for this visit.    Glenetta Hew, M.D., M.S. Interventional Cardiologist   Pager # [phone] Phone # [phone] [address] [place], [place] [address]   Thank you for choosing Heartcare at [facility]!!

## 2022-02-21 NOTE — Patient Instructions (Addendum)
Medication Instructions:   No changes  *If you need a refill on your cardiac medications before your next appointment, please call your pharmacy*   Lab Work: Not needed    Testing/Procedures: Not needed   Follow-Up: At [facility], you and your health needs are our priority.  As part of our continuing mission to provide you with exceptional heart care, we have created designated Provider Care Teams.  These Care Teams include your primary Cardiologist (physician) and Advanced Practice Providers (APPs -  Physician Assistants and Nurse Practitioners) who all work together to provide you with the care you need, when you need it.     Your next appointment:   6  to 7 month(s)  The format for your next appointment:   In Person  Provider:   Glenetta Hew, MD    Other Instructions  You have been referred to Dr Burt Knack, or Dr Ali Lowe-- closure of PFO

## 2022-02-22 ENCOUNTER — Encounter: Payer: Self-pay | Admitting: Cardiology

## 2022-02-22 NOTE — Assessment & Plan Note (Signed)
Referred actually by neurology as well as PCP for cardiac evaluation based on echocardiogram suggesting PFO.  She had a 10-minute spell of aphasia that resolved.  Has not had further symptoms.  She was started on aspirin by neurology.  Blood pressures been well controlled with irbesartan-HCTZ, and is on 20 mg atorvastatin

## 2022-02-22 NOTE — Assessment & Plan Note (Signed)
Well-controlled BP on current dose of ARB-HCTZ.  No change.

## 2022-02-22 NOTE — Assessment & Plan Note (Signed)
She has had longstanding migraine headaches, and is possible that this could also be related to PFO.  Additional indication for possible PFO closure.

## 2022-02-22 NOTE — Assessment & Plan Note (Signed)
Positive bubble study with PFO and ASA. She is on aspirin with no further episodes.  Given the size of the PFO, question the benefit from possible PFO closure.  We will refer to our structural heart team-Dr. Sherren Mocha and Dr Lenna Sciara for evaluation to determine candidacy for possible closure.

## 2022-03-07 DIAGNOSIS — H938X3 Other specified disorders of ear, bilateral: Secondary | ICD-10-CM | POA: Diagnosis not present

## 2022-03-07 DIAGNOSIS — H6123 Impacted cerumen, bilateral: Secondary | ICD-10-CM | POA: Diagnosis not present

## 2022-03-12 ENCOUNTER — Encounter: Payer: Self-pay | Admitting: Cardiovascular Disease

## 2022-03-12 ENCOUNTER — Ambulatory Visit (INDEPENDENT_AMBULATORY_CARE_PROVIDER_SITE_OTHER): Payer: Medicare Other | Admitting: Cardiovascular Disease

## 2022-03-12 VITALS — BP 114/70 | HR 59 | Ht 63.5 in | Wt 173.2 lb

## 2022-03-12 DIAGNOSIS — Q2112 Patent foramen ovale: Secondary | ICD-10-CM | POA: Diagnosis not present

## 2022-03-12 DIAGNOSIS — I253 Aneurysm of heart: Secondary | ICD-10-CM | POA: Diagnosis not present

## 2022-03-12 NOTE — Patient Instructions (Signed)
Medication Instructions:  Your physician recommends that you continue on your current medications as directed. Please refer to the Current Medication list given to you today.  *If you need a refill on your cardiac medications before your next appointment, please call your pharmacy*   Lab Work: NONE If you have labs (blood work) drawn today and your tests are completely normal, you will receive your results only by: [place] (if you have MyChart) OR A paper copy in the mail If you have any lab test that is abnormal or we need to change your treatment, we will call you to review the results.   Testing/Procedures: NONE   Follow-Up: At [facility], you and your health needs are our priority.  As part of our continuing mission to provide you with exceptional heart care, we have created designated Provider Care Teams.  These Care Teams include your primary Cardiologist (physician) and Advanced Practice Providers (APPs -  Physician Assistants and Nurse Practitioners) who all work together to provide you with the care you need, when you need it.  We recommend signing up for the patient portal called "MyChart".  Sign up information is provided on this After Visit Summary.  MyChart is used to connect with patients for Virtual Visits (Telemedicine).  Patients are able to view lab/test results, encounter notes, upcoming appointments, etc.  Non-urgent messages can be sent to your provider as well.   To learn more about what you can do with MyChart, go to [email or web address].    Your next appointment:   As Needed   1}   Important Information About Sugar

## 2022-03-12 NOTE — Progress Notes (Signed)
Cardiology Office Note:    Date:  03/12/2022   ID:  [name], DOB Jan 27, 1951, MRN [id number]  PCP:  [facility], [facility] Cardiologist:  Glenetta Hew, MD     Referring MD: Leonie Man, MD   Chief Complaint  Patient presents with   PFO    History of Present Illness:    [name] is a 71 y.o. female referred by Dr. Ellyn Hack for evaluation of PFO closure.  The patient is here with her son today.  She had a transient episode in February of this year when she developed expressive aphasia for about 10 minutes.  She had no other localizing symptoms and denied any unilateral weakness, numbness, or tingling.  She has had no similar symptoms since that time.  She does have a long history of migraine headaches and was experiencing some headache around the time of her episode but not exactly at the same time.  She was evaluated in the emergency department then ultimately referred for formal neurologic evaluation.  An MRI of the brain showed no evidence of stroke and no significant abnormality.  The patient underwent an echo with bubble study that showed atrial septal aneurysm and was positive for right to left intracardiac shunting, likely through a PFO.  She is noted to have aortic atherosclerosis on prior CT scan.  She does not have any high-grade obstructive carotid disease.  The remainder of her work-up was negative.  The patient has no chest pain or shortness of breath.  She has a history of hypertension and hyperlipidemia, both controlled over time on medical therapy.  No history of clotting disorder, myocardial infarction, or previous stroke.  Past Medical History:  Diagnosis Date   Allergic rhinitis    Allergy    Anxiety    Cancer ([place])    skin cancer corner of eye    Depression    GERD (gastroesophageal reflux disease)    H/O colonoscopy    5 14 09 Dr Oletta Lamas   Hiatal hernia    Hyperlipidemia    Hypertension    IBS (irritable bowel  syndrome)    Internal hemorrhoids    Irregular heart beats    Migraine    Neuromuscular disorder ([place])    Hiatal hernia    PFO with atrial septal aneurysm 02/21/2022   2D Echo 02/12/2022: Interatrial septum shows evidence of atrial septal "aneurysm" with PFO and intermittent large right to left shunt.  Positive bubble study suggestive of intra-atrial shunt.   Spinal stenosis    TIA (transient ischemic attack) 10/2021    Past Surgical History:  Procedure Laterality Date   71 HOUR [place] STUDY N/A 03/16/2017   Procedure: 24 HOUR PH STUDY;  Surgeon: Jerene Bears, MD;  Location: [facility];  Service: Gastroenterology;  Laterality: N/A;   COLONOSCOPY     ESOPHAGEAL MANOMETRY N/A 03/16/2017   Procedure: ESOPHAGEAL MANOMETRY (EM);  Surgeon: Jerene Bears, MD;  Location: [facility];  Service: Gastroenterology;  Laterality: N/A;   TONSILLECTOMY     TRANSTHORACIC ECHOCARDIOGRAM  02/12/2022   (For TIA) EF 60 to 65%.  No RWMA.  GR 1 DD.  Normal RV function with normal PAP.  IAS shows evidence of atrial septal "aneurysm" with PFO and intermittent large right to left shunt.  Positive bubble study suggestive of intra-atrial shunt..  Normal valves.   VAGINAL HYSTERECTOMY     WISDOM TOOTH EXTRACTION      Current Medications: Current Meds  Medication Sig   acetaminophen (TYLENOL) 500 MG tablet Take 500 mg by mouth every 6 (six) hours as needed.   AMBULATORY NON FORMULARY MEDICATION once a week. Allergy shot  2 allergy shots weekly   aspirin 81 MG chewable tablet Chew 81 mg by mouth daily.   atorvastatin (LIPITOR) 20 MG tablet Take 20 mg by mouth daily.   busPIRone (BUSPAR) 7.5 MG tablet Take 7.5 mg by mouth 2 (two) times daily.   Cholecalciferol (VITAMIN D3 PO) Take 1 tablet by mouth daily.   citalopram (CELEXA) 10 MG tablet Take 40 mg by mouth daily.   EPINEPHrine 0.3 mg/0.3 mL IJ SOAJ injection Inject into the muscle.   irbesartan-hydrochlorothiazide (AVALIDE) 150-12.5 MG tablet Take 1 tablet by  mouth daily.   meloxicam (MOBIC) 7.5 MG tablet Take 7.5 mg by mouth daily.   meloxicam (MOBIC) 7.5 MG tablet 1 tablet Orally Once a day   montelukast (SINGULAIR) 10 MG tablet Take 10 mg by mouth at bedtime.   ondansetron (ZOFRAN-ODT) 4 MG disintegrating tablet Take 4 mg by mouth every 6 (six) hours as needed for vomiting or nausea.   pantoprazole (PROTONIX) 40 MG tablet Take 1 tablet (40 mg total) by mouth 2 (two) times daily. (Patient taking differently: Take 40 mg by mouth daily.)   SUMAtriptan (IMITREX) 100 MG tablet Take 100 mg by mouth daily as needed.   vitamin B-12 (CYANOCOBALAMIN) 500 MCG tablet Take 500 mcg by mouth daily.     Allergies:   Patient has no known allergies.   Social History   Socioeconomic History   Marital status: Married    Spouse name: Dominica Severin   Number of children: Not on file   Years of education: Not on file   Highest education level: Not on file  Occupational History   Not on file  Tobacco Use   Smoking status: Never   Smokeless tobacco: Never  Vaping Use   Vaping Use: Never used  Substance and Sexual Activity   Alcohol use: Yes    Comment: very rare   Drug use: No   Sexual activity: Not on file  Other Topics Concern   Not on file  Social History Narrative   Lives with husband   Caffeine- coffee 1 c, 2 glasses of tea   Social Determinants of Health   Financial Resource Strain: Not on file  Food Insecurity: Not on file  Transportation Needs: Not on file  Physical Activity: Not on file  Stress: Not on file  Social Connections: Not on file     Family History: The patient's family history includes AAA (abdominal aortic aneurysm) in her mother; Alcohol abuse in her father; COPD in her brother and mother; Cancer in her cousin, mother, and paternal grandmother; Colon polyps in her mother; Diabetes in her brother; Emphysema in her father; Heart disease in her mother; Hypertension in her father and mother; Irritable bowel syndrome in her cousin,  maternal aunt, and mother; Sleep apnea in her brother; Uterine cancer in her mother. There is no history of Colon cancer, Esophageal cancer, Rectal cancer, or Stomach cancer.  ROS:   Please see the history of present illness.    All other systems reviewed and are negative.  EKGs/Labs/Other Studies Reviewed:    The following studies were reviewed today: MR Brain 10/29/21: IMPRESSION: No evidence of recent infarction, hemorrhage, or mass. No abnormal enhancement.   No large vessel occlusion, hemodynamically significant stenosis, or evidence of dissection.   No evidence of dural sinus thrombosis.  EKG:  EKG is ordered today.  The ekg ordered today demonstrates NSR 59 bpm, possible age-indeterminate septal infarct, otherwise within normal limits  Recent Labs: 10/29/2021: ALT 26; BUN 16; Creatinine, Ser 1.10; Hemoglobin 12.4; Platelets 278; Potassium 3.9; Sodium 137  Recent Lipid Panel No results found for: "CHOL", "TRIG", "HDL", "CHOLHDL", "VLDL", "LDLCALC", "LDLDIRECT"   Risk Assessment/Calculations:           Physical Exam:    VS:  BP 114/70   Pulse (!) 59   Ht 5' 3.5" (1.613 m)   Wt 173 lb 3.2 oz (78.6 kg)   SpO2 99%   BMI 30.20 kg/m     Wt Readings from Last 3 Encounters:  03/12/22 173 lb 3.2 oz (78.6 kg)  02/21/22 172 lb (78 kg)  12/02/21 171 lb 6.4 oz (77.7 kg)     GEN:  Well nourished, well developed in no acute distress HEENT: Normal NECK: No JVD; No carotid bruits LYMPHATICS: No lymphadenopathy CARDIAC: RRR, no murmurs, rubs, gallops RESPIRATORY:  Clear to auscultation without rales, wheezing or rhonchi  ABDOMEN: Soft, non-tender, non-distended MUSCULOSKELETAL:  No edema; No deformity  SKIN: Warm and dry NEUROLOGIC:  Alert and oriented x 3 PSYCHIATRIC:  Normal affect   ASSESSMENT:    1. PFO with atrial septal aneurysm    PLAN:    In order of problems listed above:  All available records are reviewed.  I personally reviewed the patient's echo  images.  She does have a significant right to left shunt with a bolus of agitated saline crossing the interatrial septum during the respiratory cycle.  There is no other significant cardiac disease noted.  We discussed the potential association between PFO and cryptogenic stroke today.  We reviewed available clinical trial data comparing antiplatelet therapy with aspirin and-or clopidogrel with transcatheter PFO closure.  I demonstrated a PFO closure device to the patient.  The patient's rope score is very low at 3, suggesting a very low likelihood that her neurologic event was "PFO-related."  This is based on her age of 55, presence of hypertension, and no evidence of cortical infarction on brain MRI imaging.  With a negative MRI and factors outlined above, I think it is most appropriate to treat her medically with antiplatelet therapy using aspirin 81 mg daily.  Certainly, if she has a recurrent event or evidence of cardioembolic stroke, she would be an appropriate candidate for further evaluation with transesophageal echo and potential transcatheter PFO closure.  All of this is discussed in detail with the patient and her son today through a shared decision-making model.  She understands and agrees with our plan for medical therapy.  I would be happy to see her back in the future if problems arise.      Medication Adjustments/Labs and Tests Ordered: Current medicines are reviewed at length with the patient today.  Concerns regarding medicines are outlined above.  Orders Placed This Encounter  Procedures   EKG 12-Lead   No orders of the defined types were placed in this encounter.   Patient Instructions  Medication Instructions:  Your physician recommends that you continue on your current medications as directed. Please refer to the Current Medication list given to you today.  *If you need a refill on your cardiac medications before your next appointment, please call your pharmacy*   Lab  Work: NONE If you have labs (blood work) drawn today and your tests are completely normal, you will receive your results only by: [place] (if you  have MyChart) OR A paper copy in the mail If you have any lab test that is abnormal or we need to change your treatment, we will call you to review the results.   Testing/Procedures: NONE   Follow-Up: At [facility], you and your health needs are our priority.  As part of our continuing mission to provide you with exceptional heart care, we have created designated Provider Care Teams.  These Care Teams include your primary Cardiologist (physician) and Advanced Practice Providers (APPs -  Physician Assistants and Nurse Practitioners) who all work together to provide you with the care you need, when you need it.  We recommend signing up for the patient portal called "MyChart".  Sign up information is provided on this After Visit Summary.  MyChart is used to connect with patients for Virtual Visits (Telemedicine).  Patients are able to view lab/test results, encounter notes, upcoming appointments, etc.  Non-urgent messages can be sent to your provider as well.   To learn more about what you can do with MyChart, go to [email or web address].    Your next appointment:   As Needed   1}   Important Information About Sugar         Signed, Sherren Mocha, MD  03/12/2022 1:14 PM    [place]

## 2022-04-23 ENCOUNTER — Encounter: Payer: Self-pay | Admitting: Psychiatry

## 2022-04-23 ENCOUNTER — Ambulatory Visit (INDEPENDENT_AMBULATORY_CARE_PROVIDER_SITE_OTHER): Payer: Medicare Other | Admitting: Psychiatry

## 2022-04-23 VITALS — BP 92/55 | HR 69 | Ht 64.0 in | Wt 173.0 lb

## 2022-04-23 DIAGNOSIS — G43109 Migraine with aura, not intractable, without status migrainosus: Secondary | ICD-10-CM

## 2022-04-23 DIAGNOSIS — R4701 Aphasia: Secondary | ICD-10-CM | POA: Diagnosis not present

## 2022-04-23 MED ORDER — NURTEC 75 MG PO TBDP: 75.0000 mg | ORAL_TABLET | ORAL | 6 refills | Status: AC | PRN

## 2022-04-23 NOTE — Progress Notes (Signed)
   CC:  headaches  Follow-up Visit  Last visit: 12/02/21  Brief HPI: 71 year old female with a history of B12 deficiency, HTN, CKD, HLD, spinal stenosis who follows in clinic for migraines. In February 2022 she had a brief episode of garbled speech associated with headache. MRI/MRA/MRV were negative.  At her last visit TTE was ordered. She was counseled to avoid Imitrex while undergoing stroke workup.  Interval History: TTE showed an atrial septal aneurysm with PFO. She was referred to Cardiology who recommended continuing ASA 81 daily.   She has been feeling well. Has not had any more migraines since her last visit. Denies any new episodes of garbled speech or other neurologic symptoms.   Current Headache Regimen: Preventative: none Abortive: none  Prior Therapies                                  Imitrex 100 mg PRN Irbesartan Celexa 40 mg daily Gabapentin 300 mg TID - drowsiness  Physical Exam:   Vital Signs: BP (!) 92/55   Pulse 69   Ht '5\' 4"'$  (1.626 m)   Wt 173 lb (78.5 kg)   BMI 29.70 kg/m  GENERAL:  well appearing, in no acute distress, alert  SKIN:  Color, texture, turgor normal. No rashes or lesions HEAD:  Normocephalic/atraumatic. RESP: normal respiratory effort MSK:  No gross joint deformities.   NEUROLOGICAL: Mental Status: Alert, oriented to person, place and time, Follows commands, and Speech fluent and appropriate. Cranial Nerves: PERRL, face symmetric, no dysarthria, hearing grossly intact Motor: moves all extremities equally Gait: normal-based.  IMPRESSION: 71 year old female with a history of B12 deficiency, HTN, CKD, HLD, spinal stenosis who presents for follow up of migraines and aphasia. She has been doing well with no further episodes of garbled speech and no migraines since her last visit. Would avoid triptans due to possible TIA. Will start Nurtec for migraine rescue.  PLAN: -Start Nurtec 75 mg PRN -continue ASA 81 mg daily for stroke  prevention   Follow-up: 1 year or sooner if needed  I spent a total of 17 minutes on the date of the service. Headache education was done. Discussed treatment options including acute medications. Discussed medication side effects, adverse reactions and drug interactions. Written educational materials and patient instructions outlining all of the above were given.  Genia Harold, MD 04/23/22 2:32 PM

## 2022-04-23 NOTE — Patient Instructions (Signed)
Start Nurtec 75 mg as needed for migraine. Take one pill at first sign of migraine. Max dose 1 pill in 24 hours

## 2022-04-24 ENCOUNTER — Telehealth: Payer: Self-pay | Admitting: *Deleted

## 2022-04-24 NOTE — Telephone Encounter (Signed)
Nurtec This drug has been approved under the Member's Medicare Part D benefit. Approved quantity: 8 units per 30 day(s). You may fill up to a 90 day supply except for those on Specialty Tier 5, which can be filled up to a 30 day supply. Approval faxed to pharmacy.

## 2022-04-24 NOTE — Telephone Encounter (Signed)
Nurtec PA, Key: [id number], [id number]. Your information has been sent to [facility].

## 2022-05-01 DIAGNOSIS — J301 Allergic rhinitis due to pollen: Secondary | ICD-10-CM | POA: Diagnosis not present

## 2022-06-09 DIAGNOSIS — J301 Allergic rhinitis due to pollen: Secondary | ICD-10-CM | POA: Diagnosis not present

## 2022-06-10 DIAGNOSIS — Z6831 Body mass index (BMI) 31.0-31.9, adult: Secondary | ICD-10-CM | POA: Diagnosis not present

## 2022-06-10 DIAGNOSIS — Z1231 Encounter for screening mammogram for malignant neoplasm of breast: Secondary | ICD-10-CM | POA: Diagnosis not present

## 2022-06-10 DIAGNOSIS — Z01419 Encounter for gynecological examination (general) (routine) without abnormal findings: Secondary | ICD-10-CM | POA: Diagnosis not present

## 2022-07-29 DIAGNOSIS — Z23 Encounter for immunization: Secondary | ICD-10-CM | POA: Diagnosis not present

## 2022-07-29 DIAGNOSIS — E538 Deficiency of other specified B group vitamins: Secondary | ICD-10-CM | POA: Diagnosis not present

## 2022-07-29 DIAGNOSIS — N1831 Chronic kidney disease, stage 3a: Secondary | ICD-10-CM | POA: Diagnosis not present

## 2022-07-29 DIAGNOSIS — M8580 Other specified disorders of bone density and structure, unspecified site: Secondary | ICD-10-CM | POA: Diagnosis not present

## 2022-07-29 DIAGNOSIS — K219 Gastro-esophageal reflux disease without esophagitis: Secondary | ICD-10-CM | POA: Diagnosis not present

## 2022-07-29 DIAGNOSIS — R7989 Other specified abnormal findings of blood chemistry: Secondary | ICD-10-CM | POA: Diagnosis not present

## 2022-07-29 DIAGNOSIS — I253 Aneurysm of heart: Secondary | ICD-10-CM | POA: Diagnosis not present

## 2022-07-29 DIAGNOSIS — M4800 Spinal stenosis, site unspecified: Secondary | ICD-10-CM | POA: Diagnosis not present

## 2022-07-29 DIAGNOSIS — I129 Hypertensive chronic kidney disease with stage 1 through stage 4 chronic kidney disease, or unspecified chronic kidney disease: Secondary | ICD-10-CM | POA: Diagnosis not present

## 2022-07-29 DIAGNOSIS — Q2112 Patent foramen ovale: Secondary | ICD-10-CM | POA: Diagnosis not present

## 2022-07-29 DIAGNOSIS — G459 Transient cerebral ischemic attack, unspecified: Secondary | ICD-10-CM | POA: Diagnosis not present

## 2022-07-29 DIAGNOSIS — R059 Cough, unspecified: Secondary | ICD-10-CM | POA: Diagnosis not present

## 2022-08-21 DIAGNOSIS — J301 Allergic rhinitis due to pollen: Secondary | ICD-10-CM | POA: Diagnosis not present

## 2022-08-27 DIAGNOSIS — J301 Allergic rhinitis due to pollen: Secondary | ICD-10-CM | POA: Diagnosis not present

## 2022-09-29 DIAGNOSIS — L738 Other specified follicular disorders: Secondary | ICD-10-CM | POA: Diagnosis not present

## 2022-09-29 DIAGNOSIS — L281 Prurigo nodularis: Secondary | ICD-10-CM | POA: Diagnosis not present

## 2022-10-21 DIAGNOSIS — H40023 Open angle with borderline findings, high risk, bilateral: Secondary | ICD-10-CM | POA: Diagnosis not present

## 2022-10-28 DIAGNOSIS — L292 Pruritus vulvae: Secondary | ICD-10-CM | POA: Diagnosis not present

## 2022-11-06 DIAGNOSIS — L738 Other specified follicular disorders: Secondary | ICD-10-CM | POA: Diagnosis not present

## 2022-11-06 DIAGNOSIS — L237 Allergic contact dermatitis due to plants, except food: Secondary | ICD-10-CM | POA: Diagnosis not present

## 2022-12-18 DIAGNOSIS — J301 Allergic rhinitis due to pollen: Secondary | ICD-10-CM | POA: Diagnosis not present

## 2022-12-24 DIAGNOSIS — J301 Allergic rhinitis due to pollen: Secondary | ICD-10-CM | POA: Diagnosis not present

## 2022-12-25 DIAGNOSIS — K219 Gastro-esophageal reflux disease without esophagitis: Secondary | ICD-10-CM | POA: Diagnosis not present

## 2022-12-25 DIAGNOSIS — R002 Palpitations: Secondary | ICD-10-CM | POA: Diagnosis not present

## 2022-12-25 DIAGNOSIS — E785 Hyperlipidemia, unspecified: Secondary | ICD-10-CM | POA: Diagnosis not present

## 2022-12-31 DIAGNOSIS — I129 Hypertensive chronic kidney disease with stage 1 through stage 4 chronic kidney disease, or unspecified chronic kidney disease: Secondary | ICD-10-CM | POA: Diagnosis not present

## 2022-12-31 DIAGNOSIS — Z1339 Encounter for screening examination for other mental health and behavioral disorders: Secondary | ICD-10-CM | POA: Diagnosis not present

## 2022-12-31 DIAGNOSIS — G459 Transient cerebral ischemic attack, unspecified: Secondary | ICD-10-CM | POA: Diagnosis not present

## 2022-12-31 DIAGNOSIS — Z23 Encounter for immunization: Secondary | ICD-10-CM | POA: Diagnosis not present

## 2022-12-31 DIAGNOSIS — Q2112 Patent foramen ovale: Secondary | ICD-10-CM | POA: Diagnosis not present

## 2022-12-31 DIAGNOSIS — R82998 Other abnormal findings in urine: Secondary | ICD-10-CM | POA: Diagnosis not present

## 2022-12-31 DIAGNOSIS — N1831 Chronic kidney disease, stage 3a: Secondary | ICD-10-CM | POA: Diagnosis not present

## 2022-12-31 DIAGNOSIS — E538 Deficiency of other specified B group vitamins: Secondary | ICD-10-CM | POA: Diagnosis not present

## 2022-12-31 DIAGNOSIS — Z1331 Encounter for screening for depression: Secondary | ICD-10-CM | POA: Diagnosis not present

## 2022-12-31 DIAGNOSIS — K219 Gastro-esophageal reflux disease without esophagitis: Secondary | ICD-10-CM | POA: Diagnosis not present

## 2022-12-31 DIAGNOSIS — E785 Hyperlipidemia, unspecified: Secondary | ICD-10-CM | POA: Diagnosis not present

## 2022-12-31 DIAGNOSIS — Z0000 Encounter for general adult medical examination without abnormal findings: Secondary | ICD-10-CM | POA: Diagnosis not present

## 2022-12-31 DIAGNOSIS — M4800 Spinal stenosis, site unspecified: Secondary | ICD-10-CM | POA: Diagnosis not present

## 2023-04-27 ENCOUNTER — Ambulatory Visit: Payer: Medicare Other | Admitting: Psychiatry

## 2023-04-30 DIAGNOSIS — J301 Allergic rhinitis due to pollen: Secondary | ICD-10-CM | POA: Diagnosis not present

## 2023-05-06 DIAGNOSIS — J301 Allergic rhinitis due to pollen: Secondary | ICD-10-CM | POA: Diagnosis not present

## 2023-05-22 DIAGNOSIS — F39 Unspecified mood [affective] disorder: Secondary | ICD-10-CM | POA: Diagnosis not present

## 2023-05-22 DIAGNOSIS — Z23 Encounter for immunization: Secondary | ICD-10-CM | POA: Diagnosis not present

## 2023-05-22 DIAGNOSIS — G459 Transient cerebral ischemic attack, unspecified: Secondary | ICD-10-CM | POA: Diagnosis not present

## 2023-05-22 DIAGNOSIS — M8580 Other specified disorders of bone density and structure, unspecified site: Secondary | ICD-10-CM | POA: Diagnosis not present

## 2023-05-22 DIAGNOSIS — Q2112 Patent foramen ovale: Secondary | ICD-10-CM | POA: Diagnosis not present

## 2023-05-22 DIAGNOSIS — I129 Hypertensive chronic kidney disease with stage 1 through stage 4 chronic kidney disease, or unspecified chronic kidney disease: Secondary | ICD-10-CM | POA: Diagnosis not present

## 2023-05-22 DIAGNOSIS — N1831 Chronic kidney disease, stage 3a: Secondary | ICD-10-CM | POA: Diagnosis not present

## 2023-05-22 DIAGNOSIS — M4800 Spinal stenosis, site unspecified: Secondary | ICD-10-CM | POA: Diagnosis not present

## 2023-05-22 DIAGNOSIS — K219 Gastro-esophageal reflux disease without esophagitis: Secondary | ICD-10-CM | POA: Diagnosis not present

## 2023-05-22 DIAGNOSIS — E785 Hyperlipidemia, unspecified: Secondary | ICD-10-CM | POA: Diagnosis not present

## 2023-05-22 DIAGNOSIS — I253 Aneurysm of heart: Secondary | ICD-10-CM | POA: Diagnosis not present

## 2023-05-22 DIAGNOSIS — E538 Deficiency of other specified B group vitamins: Secondary | ICD-10-CM | POA: Diagnosis not present

## 2023-06-16 DIAGNOSIS — H40023 Open angle with borderline findings, high risk, bilateral: Secondary | ICD-10-CM | POA: Diagnosis not present

## 2023-06-25 DIAGNOSIS — M8588 Other specified disorders of bone density and structure, other site: Secondary | ICD-10-CM | POA: Diagnosis not present

## 2023-06-25 DIAGNOSIS — Z1231 Encounter for screening mammogram for malignant neoplasm of breast: Secondary | ICD-10-CM | POA: Diagnosis not present

## 2023-06-25 DIAGNOSIS — Z6832 Body mass index (BMI) 32.0-32.9, adult: Secondary | ICD-10-CM | POA: Diagnosis not present

## 2023-06-25 DIAGNOSIS — Z01419 Encounter for gynecological examination (general) (routine) without abnormal findings: Secondary | ICD-10-CM | POA: Diagnosis not present

## 2023-06-25 DIAGNOSIS — N958 Other specified menopausal and perimenopausal disorders: Secondary | ICD-10-CM | POA: Diagnosis not present

## 2023-06-30 DIAGNOSIS — R509 Fever, unspecified: Secondary | ICD-10-CM | POA: Diagnosis not present

## 2023-06-30 DIAGNOSIS — J309 Allergic rhinitis, unspecified: Secondary | ICD-10-CM | POA: Diagnosis not present

## 2023-06-30 DIAGNOSIS — R5383 Other fatigue: Secondary | ICD-10-CM | POA: Diagnosis not present

## 2023-06-30 DIAGNOSIS — R058 Other specified cough: Secondary | ICD-10-CM | POA: Diagnosis not present

## 2023-06-30 DIAGNOSIS — Z1152 Encounter for screening for COVID-19: Secondary | ICD-10-CM | POA: Diagnosis not present

## 2023-06-30 DIAGNOSIS — R0981 Nasal congestion: Secondary | ICD-10-CM | POA: Diagnosis not present

## 2023-08-13 DIAGNOSIS — J309 Allergic rhinitis, unspecified: Secondary | ICD-10-CM | POA: Diagnosis not present

## 2023-08-13 DIAGNOSIS — Z9289 Personal history of other medical treatment: Secondary | ICD-10-CM | POA: Diagnosis not present

## 2023-08-13 DIAGNOSIS — R053 Chronic cough: Secondary | ICD-10-CM | POA: Diagnosis not present

## 2023-08-17 DIAGNOSIS — J309 Allergic rhinitis, unspecified: Secondary | ICD-10-CM | POA: Diagnosis not present

## 2023-08-20 DIAGNOSIS — J309 Allergic rhinitis, unspecified: Secondary | ICD-10-CM | POA: Diagnosis not present

## 2023-10-20 DIAGNOSIS — H40023 Open angle with borderline findings, high risk, bilateral: Secondary | ICD-10-CM | POA: Diagnosis not present

## 2023-12-17 DIAGNOSIS — J309 Allergic rhinitis, unspecified: Secondary | ICD-10-CM | POA: Diagnosis not present

## 2023-12-30 DIAGNOSIS — J309 Allergic rhinitis, unspecified: Secondary | ICD-10-CM | POA: Diagnosis not present

## 2024-01-05 DIAGNOSIS — Z1212 Encounter for screening for malignant neoplasm of rectum: Secondary | ICD-10-CM | POA: Diagnosis not present

## 2024-01-05 DIAGNOSIS — I129 Hypertensive chronic kidney disease with stage 1 through stage 4 chronic kidney disease, or unspecified chronic kidney disease: Secondary | ICD-10-CM | POA: Diagnosis not present

## 2024-01-05 DIAGNOSIS — N1831 Chronic kidney disease, stage 3a: Secondary | ICD-10-CM | POA: Diagnosis not present

## 2024-01-05 DIAGNOSIS — E785 Hyperlipidemia, unspecified: Secondary | ICD-10-CM | POA: Diagnosis not present

## 2024-01-12 DIAGNOSIS — I253 Aneurysm of heart: Secondary | ICD-10-CM | POA: Diagnosis not present

## 2024-01-12 DIAGNOSIS — I129 Hypertensive chronic kidney disease with stage 1 through stage 4 chronic kidney disease, or unspecified chronic kidney disease: Secondary | ICD-10-CM | POA: Diagnosis not present

## 2024-01-12 DIAGNOSIS — N1831 Chronic kidney disease, stage 3a: Secondary | ICD-10-CM | POA: Diagnosis not present

## 2024-01-12 DIAGNOSIS — E538 Deficiency of other specified B group vitamins: Secondary | ICD-10-CM | POA: Diagnosis not present

## 2024-01-12 DIAGNOSIS — E785 Hyperlipidemia, unspecified: Secondary | ICD-10-CM | POA: Diagnosis not present

## 2024-01-12 DIAGNOSIS — Z1331 Encounter for screening for depression: Secondary | ICD-10-CM | POA: Diagnosis not present

## 2024-01-12 DIAGNOSIS — Z8673 Personal history of transient ischemic attack (TIA), and cerebral infarction without residual deficits: Secondary | ICD-10-CM | POA: Diagnosis not present

## 2024-01-12 DIAGNOSIS — Z1339 Encounter for screening examination for other mental health and behavioral disorders: Secondary | ICD-10-CM | POA: Diagnosis not present

## 2024-01-12 DIAGNOSIS — R82998 Other abnormal findings in urine: Secondary | ICD-10-CM | POA: Diagnosis not present

## 2024-01-12 DIAGNOSIS — Q2112 Patent foramen ovale: Secondary | ICD-10-CM | POA: Diagnosis not present

## 2024-01-12 DIAGNOSIS — K219 Gastro-esophageal reflux disease without esophagitis: Secondary | ICD-10-CM | POA: Diagnosis not present

## 2024-01-12 DIAGNOSIS — F39 Unspecified mood [affective] disorder: Secondary | ICD-10-CM | POA: Diagnosis not present

## 2024-01-12 DIAGNOSIS — R059 Cough, unspecified: Secondary | ICD-10-CM | POA: Diagnosis not present

## 2024-01-12 DIAGNOSIS — Z0000 Encounter for general adult medical examination without abnormal findings: Secondary | ICD-10-CM | POA: Diagnosis not present

## 2024-01-12 DIAGNOSIS — M4800 Spinal stenosis, site unspecified: Secondary | ICD-10-CM | POA: Diagnosis not present

## 2024-01-12 DIAGNOSIS — M8580 Other specified disorders of bone density and structure, unspecified site: Secondary | ICD-10-CM | POA: Diagnosis not present

## 2024-01-12 DIAGNOSIS — L237 Allergic contact dermatitis due to plants, except food: Secondary | ICD-10-CM | POA: Diagnosis not present

## 2024-04-19 DIAGNOSIS — H40023 Open angle with borderline findings, high risk, bilateral: Secondary | ICD-10-CM | POA: Diagnosis not present

## 2024-04-28 DIAGNOSIS — J309 Allergic rhinitis, unspecified: Secondary | ICD-10-CM | POA: Diagnosis not present

## 2024-05-05 DIAGNOSIS — J309 Allergic rhinitis, unspecified: Secondary | ICD-10-CM | POA: Diagnosis not present

## 2024-07-12 DIAGNOSIS — L237 Allergic contact dermatitis due to plants, except food: Secondary | ICD-10-CM | POA: Diagnosis not present

## 2024-07-12 DIAGNOSIS — E785 Hyperlipidemia, unspecified: Secondary | ICD-10-CM | POA: Diagnosis not present

## 2024-07-12 DIAGNOSIS — Q2112 Patent foramen ovale: Secondary | ICD-10-CM | POA: Diagnosis not present

## 2024-07-12 DIAGNOSIS — I253 Aneurysm of heart: Secondary | ICD-10-CM | POA: Diagnosis not present

## 2024-07-12 DIAGNOSIS — Z23 Encounter for immunization: Secondary | ICD-10-CM | POA: Diagnosis not present

## 2024-07-12 DIAGNOSIS — I129 Hypertensive chronic kidney disease with stage 1 through stage 4 chronic kidney disease, or unspecified chronic kidney disease: Secondary | ICD-10-CM | POA: Diagnosis not present

## 2024-07-12 DIAGNOSIS — E538 Deficiency of other specified B group vitamins: Secondary | ICD-10-CM | POA: Diagnosis not present

## 2024-07-12 DIAGNOSIS — R059 Cough, unspecified: Secondary | ICD-10-CM | POA: Diagnosis not present

## 2024-07-12 DIAGNOSIS — M8580 Other specified disorders of bone density and structure, unspecified site: Secondary | ICD-10-CM | POA: Diagnosis not present

## 2024-07-12 DIAGNOSIS — H6120 Impacted cerumen, unspecified ear: Secondary | ICD-10-CM | POA: Diagnosis not present

## 2024-07-12 DIAGNOSIS — M4800 Spinal stenosis, site unspecified: Secondary | ICD-10-CM | POA: Diagnosis not present

## 2024-07-12 DIAGNOSIS — N1831 Chronic kidney disease, stage 3a: Secondary | ICD-10-CM | POA: Diagnosis not present
